# Patient Record
Sex: Female | Born: 1945 | Race: Black or African American | Hispanic: No | Marital: Single | Smoking: Current every day smoker
Health system: Southern US, Community
[De-identification: ages and names within clinical notes are randomized; demographics above are authoritative.]

## PROBLEM LIST (undated history)

## (undated) DIAGNOSIS — E7800 Pure hypercholesterolemia, unspecified: Secondary | ICD-10-CM

## (undated) DIAGNOSIS — K219 Gastro-esophageal reflux disease without esophagitis: Secondary | ICD-10-CM

## (undated) HISTORY — PX: BREAST LUMPECTOMY: SHX2

## (undated) HISTORY — PX: LEG SURGERY: SHX1003

## (undated) HISTORY — PX: TUBAL LIGATION: SHX77

---

## 1997-10-29 ENCOUNTER — Emergency Department (HOSPITAL_COMMUNITY): Admission: EM | Admit: 1997-10-29 | Discharge: 1997-10-29 | Payer: Self-pay | Admitting: Emergency Medicine

## 1997-11-05 ENCOUNTER — Encounter: Payer: Self-pay | Admitting: Internal Medicine

## 1997-11-05 ENCOUNTER — Ambulatory Visit (HOSPITAL_COMMUNITY): Admission: RE | Admit: 1997-11-05 | Discharge: 1997-11-05 | Payer: Self-pay | Admitting: Internal Medicine

## 1997-11-05 ENCOUNTER — Encounter: Admission: RE | Admit: 1997-11-05 | Discharge: 1997-11-05 | Payer: Self-pay | Admitting: Internal Medicine

## 1997-11-22 ENCOUNTER — Encounter: Admission: RE | Admit: 1997-11-22 | Discharge: 1997-12-22 | Payer: Self-pay | Admitting: *Deleted

## 1997-11-26 ENCOUNTER — Encounter: Admission: RE | Admit: 1997-11-26 | Discharge: 1997-11-26 | Payer: Self-pay | Admitting: Internal Medicine

## 1997-12-17 ENCOUNTER — Encounter: Admission: RE | Admit: 1997-12-17 | Discharge: 1997-12-17 | Payer: Self-pay | Admitting: Hematology and Oncology

## 1997-12-25 ENCOUNTER — Ambulatory Visit (HOSPITAL_COMMUNITY): Admission: RE | Admit: 1997-12-25 | Discharge: 1997-12-25 | Payer: Self-pay | Admitting: Hematology and Oncology

## 1998-01-02 ENCOUNTER — Encounter: Payer: Self-pay | Admitting: Hematology and Oncology

## 1998-01-04 ENCOUNTER — Encounter: Admission: RE | Admit: 1998-01-04 | Discharge: 1998-01-04 | Payer: Self-pay | Admitting: Internal Medicine

## 1998-02-14 ENCOUNTER — Encounter: Admission: RE | Admit: 1998-02-14 | Discharge: 1998-02-14 | Payer: Self-pay | Admitting: Internal Medicine

## 1998-06-10 ENCOUNTER — Encounter: Admission: RE | Admit: 1998-06-10 | Discharge: 1998-06-10 | Payer: Self-pay | Admitting: Internal Medicine

## 1998-09-19 ENCOUNTER — Encounter: Admission: RE | Admit: 1998-09-19 | Discharge: 1998-09-19 | Payer: Self-pay | Admitting: Internal Medicine

## 1998-09-23 ENCOUNTER — Encounter: Admission: RE | Admit: 1998-09-23 | Discharge: 1998-09-23 | Payer: Self-pay | Admitting: Obstetrics & Gynecology

## 1998-10-28 ENCOUNTER — Encounter: Admission: RE | Admit: 1998-10-28 | Discharge: 1998-10-28 | Payer: Self-pay | Admitting: Obstetrics & Gynecology

## 2001-06-20 ENCOUNTER — Ambulatory Visit (HOSPITAL_COMMUNITY): Admission: RE | Admit: 2001-06-20 | Discharge: 2001-06-20 | Payer: Self-pay | Admitting: *Deleted

## 2001-06-20 ENCOUNTER — Encounter: Payer: Self-pay | Admitting: Pediatrics

## 2011-01-07 ENCOUNTER — Other Ambulatory Visit: Payer: Self-pay

## 2011-01-07 ENCOUNTER — Emergency Department (INDEPENDENT_AMBULATORY_CARE_PROVIDER_SITE_OTHER): Payer: Medicare Other

## 2011-01-07 ENCOUNTER — Emergency Department (HOSPITAL_BASED_OUTPATIENT_CLINIC_OR_DEPARTMENT_OTHER)
Admission: EM | Admit: 2011-01-07 | Discharge: 2011-01-07 | Disposition: A | Discharge status: Home / Self Care | Payer: Medicare Other | Attending: Emergency Medicine | Admitting: Emergency Medicine

## 2011-01-07 ENCOUNTER — Encounter: Payer: Self-pay | Admitting: Emergency Medicine

## 2011-01-07 DIAGNOSIS — E7800 Pure hypercholesterolemia, unspecified: Secondary | ICD-10-CM | POA: Insufficient documentation

## 2011-01-07 DIAGNOSIS — R252 Cramp and spasm: Secondary | ICD-10-CM

## 2011-01-07 DIAGNOSIS — R0602 Shortness of breath: Secondary | ICD-10-CM

## 2011-01-07 DIAGNOSIS — R209 Unspecified disturbances of skin sensation: Secondary | ICD-10-CM

## 2011-01-07 DIAGNOSIS — J45909 Unspecified asthma, uncomplicated: Secondary | ICD-10-CM | POA: Insufficient documentation

## 2011-01-07 DIAGNOSIS — R5381 Other malaise: Secondary | ICD-10-CM

## 2011-01-07 DIAGNOSIS — R202 Paresthesia of skin: Secondary | ICD-10-CM

## 2011-01-07 DIAGNOSIS — R5383 Other fatigue: Secondary | ICD-10-CM

## 2011-01-07 DIAGNOSIS — R55 Syncope and collapse: Secondary | ICD-10-CM

## 2011-01-07 DIAGNOSIS — J323 Chronic sphenoidal sinusitis: Secondary | ICD-10-CM | POA: Insufficient documentation

## 2011-01-07 DIAGNOSIS — I10 Essential (primary) hypertension: Secondary | ICD-10-CM | POA: Insufficient documentation

## 2011-01-07 DIAGNOSIS — Z79899 Other long term (current) drug therapy: Secondary | ICD-10-CM | POA: Insufficient documentation

## 2011-01-07 DIAGNOSIS — M47814 Spondylosis without myelopathy or radiculopathy, thoracic region: Secondary | ICD-10-CM | POA: Insufficient documentation

## 2011-01-07 DIAGNOSIS — J322 Chronic ethmoidal sinusitis: Secondary | ICD-10-CM

## 2011-01-07 DIAGNOSIS — M79609 Pain in unspecified limb: Secondary | ICD-10-CM | POA: Insufficient documentation

## 2011-01-07 HISTORY — DX: Pure hypercholesterolemia, unspecified: E78.00

## 2011-01-07 LAB — BASIC METABOLIC PANEL
Chloride: 101 mEq/L (ref 96–112)
Creatinine, Ser: 0.9 mg/dL (ref 0.50–1.10)
GFR calc Af Amer: 76 mL/min — ABNORMAL LOW (ref >90)
Potassium: 4.9 mEq/L (ref 3.5–5.1)

## 2011-01-07 LAB — CBC
HCT: 41.5 % (ref 36.0–46.0)
Hemoglobin: 14.5 g/dL (ref 12.0–15.0)
RDW: 12.7 % (ref 11.5–15.5)
WBC: 7.5 10*3/uL (ref 4.0–10.5)

## 2011-01-07 LAB — TROPONIN I: Troponin I: <0.3 ng/mL (ref <0.30)

## 2011-01-07 LAB — CARDIAC PANEL(CRET KIN+CKTOT+MB+TROPI)
Relative Index: INVALID (ref 0.0–2.5)
Troponin I: <0.3 ng/mL (ref <0.30)

## 2011-01-07 NOTE — ED Provider Notes (Addendum)
History     CSN: [id number]  Arrival date & time 01/07/11  0706   First MD Initiated Contact with Patient 01/07/11 0719      Chief Complaint  Patient presents with  . Leg Pain  . Shortness of Breath  . Shaking   patient states that she stayed up all night long, making a Christmas dinner. She tried to lay down. Early this morning and then initially felt a cramping and tingling in her left lower extremity. The tingling sensation radiated upward. She initially thought that maybe do to "low potassium." She got up to walk and felt very woozy and dizzy and felt a "sloping feeling" she then became short of breath and felt like she was going to pass out.  She then had numbness and tingling in both extremities upper and lower. She denied any chest pain or nausea.  Denied any pleuritic pain or calf pain.   She felt that it may be anxiety. It began after she felt some tingling in her left leg. She also treated. The symptoms to staying up all my long admits that her sleeping hours have been very irregular lately. She denies any headache, slurred speech or blurred vision. She did call the ambulance, but decided to drive in by private vehicle. Patient had no difficulty ambulating. She now feels numbness and tingling in both feet and both hands. Patient denies any current pain or back pain. Denies abdominal pain. Denies any recent hospital admissions.  (Consider location/radiation/quality/duration/timing/severity/associated sxs/prior treatment) HPI  Past Medical History  Diagnosis Date  . Asthma   . Hypertension   . Vitamin D deficiency   . Hypercholesteremia     History reviewed. No pertinent past surgical history.  History reviewed. No pertinent family history.  History  Substance Use Topics  . Smoking status: Current Everyday Smoker  . Smokeless tobacco: Not on file  . Alcohol Use:     OB History    Grav Para Term Preterm Abortions TAB SAB Ect Mult Living                  Review of  Systems  All other systems reviewed and are negative.    Allergies  Codeine sulfate; Ibuprofen; Pravastatin sodium; and Penicillins  Home Medications   Current Outpatient Rx  Name Route Sig Dispense Refill  . ALBUTEROL SULFATE HFA 108 (90 BASE) MCG/ACT IN AERS Inhalation Inhale 2 puffs into the lungs every 6 (six) hours as needed.      . ASPIRIN 81 MG PO TABS Oral Take 81 mg by mouth daily.      Marland Kitchen CALCIUM CITRATE-VITAMIN D 200-200 MG-UNIT PO TABS Oral Take 2 tablets by mouth daily.      Marland Kitchen CETIRIZINE HCL 10 MG PO TABS Oral Take 10 mg by mouth daily.      Marland Kitchen CLONAZEPAM 1 MG PO TABS Oral Take 1 mg by mouth at bedtime as needed.      Marland Kitchen CLOTRIMAZOLE-BETAMETHASONE 1-0.05 % EX CREA Topical Apply 1 application topically 2 (two) times daily.      . CYCLOBENZAPRINE HCL 10 MG PO TABS Oral Take 10 mg by mouth at bedtime as needed.      . OMEGA-3 FATTY ACIDS 1000 MG PO CAPS Oral Take 1 g by mouth daily.      . MULTIVITAMINS PO CAPS Oral Take 1 capsule by mouth daily.      Marland Kitchen RABEPRAZOLE SODIUM 20 MG PO TBEC Oral Take 20 mg by mouth daily.      Marland Kitchen  ROSUVASTATIN CALCIUM 5 MG PO TABS Oral Take 5 mg by mouth daily.        BP 125/67  Pulse 73  Temp(Src) 97.5 F (36.4 C) (Oral)  Resp 17  SpO2 97%  Physical Exam  Nursing note and vitals reviewed. Constitutional: She is oriented to person, place, and time. She appears well-developed and well-nourished. No distress.  HENT:  Head: Normocephalic and atraumatic.  Eyes: Conjunctivae and EOM are normal. Pupils are equal, round, and reactive to light.  Neck: Neck supple.  Cardiovascular: Normal rate and regular rhythm.  Exam reveals no gallop and no friction rub.   No murmur heard. Pulmonary/Chest: Effort normal and breath sounds normal. She has no wheezes. She has no rales. She exhibits no tenderness.  Abdominal: Soft. Bowel sounds are normal. She exhibits no distension. There is no tenderness. There is no rebound and no guarding.  Musculoskeletal:  Normal range of motion.  Neurological: She is alert and oriented to person, place, and time. She has normal reflexes. She displays normal reflexes. No cranial nerve deficit. She exhibits normal muscle tone. Coordination normal.       No facial droop, tongue protrudes to the midline. No pronator drift. 5 out of 5 motor strength in upper and lower extremities, equal and symmetric bilaterally. Gait is examined, and is normal. Cranial nerves intact as tested  Skin: Skin is warm and dry. No rash noted.  Psychiatric: She has a normal mood and affect.    ED Course  Procedures (including critical care time)  Labs Reviewed  BASIC METABOLIC PANEL - Abnormal; Notable for the following:    Glucose, Bld 106 (*)    GFR calc non Af Amer 66 (*)    GFR calc Af Amer 76 (*)    All other components within normal limits  CARDIAC PANEL(CRET KIN+CKTOT+MB+TROPI)  CBC  TROPONIN I   Dg Chest 2 View  01/07/2011  *RADIOLOGY REPORT*  Clinical Data: Shortness of breath.  Weakness.  Numbness and tingling in the lower extremities.  CHEST - 2 VIEW  Comparison: None.  Findings: Mild thoracic spondylosis noted.  Cardiac and mediastinal contours appear normal.  The lungs appear clear.  No pleural effusion is identified.  IMPRESSION:  1.  Mild thoracic spondylosis. 2.   Otherwise, no significant abnormality identified.  Original Report Authenticated By: Dellia Cloud, M.D.   Ct Head Wo Contrast  01/07/2011  *RADIOLOGY REPORT*  Clinical Data: Shortness of breath.  Weakness.  Numbness and tingling in the lower extremities.  CT HEAD WITHOUT CONTRAST  Technique:  Contiguous axial images were obtained from the base of the skull through the vertex without contrast.  Comparison: None.  Findings: The brain stem, cerebellum, cerebral peduncles, thalami, basal ganglia, ventricular system, and basilar cisterns appear unremarkable.  No intracranial hemorrhage, mass lesion, or acute infarction is identified.  There is mild chronic  ethmoid sinusitis.  Minimal sphenoid sinusitis noted.  IMPRESSION:  1.  Mild chronic ethmoid and sphenoid sinusitis.   Otherwise, no significant abnormality identified.  Original Report Authenticated By: Dellia Cloud, M.D.     No diagnosis found.    MDM  Pt is seen and examined;  Initial history and physical completed.  Will follow.       Date: 01/07/2011  Rate: 84  Rhythm: normal sinus rhythm  QRS Axis: normal  Intervals: normal  ST/T Wave abnormalities: normal  Conduction Disutrbances:none  Narrative Interpretation:   Old EKG Reviewed: none available Q waves in III, aVF, LAA  Results for orders placed during the hospital encounter of 01/07/11  CARDIAC PANEL(CRET KIN+CKTOT+MB+TROPI)      Component Value Range   Total CK 74  7 - 177 (U/L)   CK, MB 2.3  0.3 - 4.0 (ng/mL)   Troponin I <0.30  <0.30 (ng/mL)   Relative Index RELATIVE INDEX IS INVALID  0.0 - 2.5   BASIC METABOLIC PANEL      Component Value Range   Sodium 137  135 - 145 (mEq/L)   Potassium 4.9  3.5 - 5.1 (mEq/L)   Chloride 101  96 - 112 (mEq/L)   CO2 25  19 - 32 (mEq/L)   Glucose, Bld 106 (*) 70 - 99 (mg/dL)   BUN 13  6 - 23 (mg/dL)   Creatinine, Ser 1.61  0.50 - 1.10 (mg/dL)   Calcium 9.8  8.4 - 09.6 (mg/dL)   GFR calc non Af Amer 66 (*) >90 (mL/min)   GFR calc Af Amer 76 (*) >90 (mL/min)  CBC      Component Value Range   WBC 7.5  4.0 - 10.5 (K/uL)   RBC 4.66  3.87 - 5.11 (MIL/uL)   Hemoglobin 14.5  12.0 - 15.0 (g/dL)   HCT 04.5  40.9 - 81.1 (%)   MCV 89.1  78.0 - 100.0 (fL)   MCH 31.1  26.0 - 34.0 (pg)   MCHC 34.9  30.0 - 36.0 (g/dL)   RDW 91.4  78.2 - 95.6 (%)   Platelets 262  150 - 400 (K/uL)  TROPONIN I      Component Value Range   Troponin I <0.30  <0.30 (ng/mL)   Dg Chest 2 View  01/07/2011  *RADIOLOGY REPORT*  Clinical Data: Shortness of breath.  Weakness.  Numbness and tingling in the lower extremities.  CHEST - 2 VIEW  Comparison: None.  Findings: Mild thoracic spondylosis  noted.  Cardiac and mediastinal contours appear normal.  The lungs appear clear.  No pleural effusion is identified.  IMPRESSION:  1.  Mild thoracic spondylosis. 2.   Otherwise, no significant abnormality identified.  Original Report Authenticated By: Dellia Cloud, M.D.   Ct Head Wo Contrast  01/07/2011  *RADIOLOGY REPORT*  Clinical Data: Shortness of breath.  Weakness.  Numbness and tingling in the lower extremities.  CT HEAD WITHOUT CONTRAST  Technique:  Contiguous axial images were obtained from the base of the skull through the vertex without contrast.  Comparison: None.  Findings: The brain stem, cerebellum, cerebral peduncles, thalami, basal ganglia, ventricular system, and basilar cisterns appear unremarkable.  No intracranial hemorrhage, mass lesion, or acute infarction is identified.  There is mild chronic ethmoid sinusitis.  Minimal sphenoid sinusitis noted.  IMPRESSION:  1.  Mild chronic ethmoid and sphenoid sinusitis.   Otherwise, no significant abnormality identified.  Original Report Authenticated By: Dellia Cloud, M.D.     10:25 AM  Patient is reassessed in the room, no longer symptomatic, feeling well. Initial tests are all normal. Cardiac markers normal. Chest x-ray and CAT scan of the head are unremarkable.  Electrolytes are normal. CBC is normal. Will likely repeat cardiac markers at 3 hours and repeat EKG and is normal. Sent home.  10:25 AM  Date: 01/07/2011  Rate: 73  Rhythm: normal sinus rhythm  QRS Axis: normal  Intervals: normal  ST/T Wave abnormalities: normal  Conduction Disutrbances:none  Narrative Interpretation:   Old EKG Reviewed: unchanged Artifact in V1, otherwise normal   10:25 AM  Reassessed, feeling well, jovial.  Has  been asymptomatic entire time in ED.  Wants to go home.  Encourage close followup with her primary medical doctor within 2-3 days. Patient also knows to return to ED for any recurring symptoms or concerns at anytime.  Savian Mazon  A. Patrica Duel, MD 01/07/11 1030  Linsay Vogt A. Patrica Duel, MD 01/07/11 [phone]

## 2011-01-07 NOTE — ED Notes (Signed)
Pt states she had gone to bed this am at 6 am, starting having cramping in feet, radiating to legs.  Pt felt sob, and felt "funny".

## 2012-03-19 ENCOUNTER — Emergency Department (HOSPITAL_BASED_OUTPATIENT_CLINIC_OR_DEPARTMENT_OTHER)
Admission: EM | Admit: 2012-03-19 | Discharge: 2012-03-19 | Disposition: A | Discharge status: Home / Self Care | Payer: Medicare Other | Attending: Emergency Medicine | Admitting: Emergency Medicine

## 2012-03-19 ENCOUNTER — Encounter (HOSPITAL_BASED_OUTPATIENT_CLINIC_OR_DEPARTMENT_OTHER): Payer: Self-pay

## 2012-03-19 DIAGNOSIS — S39012A Strain of muscle, fascia and tendon of lower back, initial encounter: Secondary | ICD-10-CM

## 2012-03-19 DIAGNOSIS — K921 Melena: Secondary | ICD-10-CM | POA: Insufficient documentation

## 2012-03-19 DIAGNOSIS — Y929 Unspecified place or not applicable: Secondary | ICD-10-CM | POA: Insufficient documentation

## 2012-03-19 DIAGNOSIS — R5381 Other malaise: Secondary | ICD-10-CM | POA: Insufficient documentation

## 2012-03-19 DIAGNOSIS — F17200 Nicotine dependence, unspecified, uncomplicated: Secondary | ICD-10-CM | POA: Insufficient documentation

## 2012-03-19 DIAGNOSIS — Z7982 Long term (current) use of aspirin: Secondary | ICD-10-CM | POA: Insufficient documentation

## 2012-03-19 DIAGNOSIS — Y93H2 Activity, gardening and landscaping: Secondary | ICD-10-CM | POA: Insufficient documentation

## 2012-03-19 DIAGNOSIS — J45909 Unspecified asthma, uncomplicated: Secondary | ICD-10-CM | POA: Insufficient documentation

## 2012-03-19 DIAGNOSIS — Z8639 Personal history of other endocrine, nutritional and metabolic disease: Secondary | ICD-10-CM | POA: Insufficient documentation

## 2012-03-19 DIAGNOSIS — X58XXXA Exposure to other specified factors, initial encounter: Secondary | ICD-10-CM | POA: Insufficient documentation

## 2012-03-19 DIAGNOSIS — R109 Unspecified abdominal pain: Secondary | ICD-10-CM | POA: Insufficient documentation

## 2012-03-19 DIAGNOSIS — IMO0002 Reserved for concepts with insufficient information to code with codable children: Secondary | ICD-10-CM | POA: Insufficient documentation

## 2012-03-19 DIAGNOSIS — Z862 Personal history of diseases of the blood and blood-forming organs and certain disorders involving the immune mechanism: Secondary | ICD-10-CM | POA: Insufficient documentation

## 2012-03-19 DIAGNOSIS — E7800 Pure hypercholesterolemia, unspecified: Secondary | ICD-10-CM | POA: Insufficient documentation

## 2012-03-19 DIAGNOSIS — K625 Hemorrhage of anus and rectum: Secondary | ICD-10-CM | POA: Insufficient documentation

## 2012-03-19 DIAGNOSIS — Z79899 Other long term (current) drug therapy: Secondary | ICD-10-CM | POA: Insufficient documentation

## 2012-03-19 LAB — CBC WITH DIFFERENTIAL/PLATELET
Band Neutrophils: 1 % (ref 0–10)
Basophils Absolute: 0 10*3/uL (ref 0.0–0.1)
Eosinophils Absolute: 0.1 10*3/uL (ref 0.0–0.7)
HCT: 41.2 % (ref 36.0–46.0)
Lymphocytes Relative: 34 % (ref 12–46)
Monocytes Absolute: 0.4 10*3/uL (ref 0.1–1.0)
Monocytes Relative: 6 % (ref 3–12)
Neutrophils Relative %: 57 % (ref 43–77)
Platelets: 257 10*3/uL (ref 150–400)
RDW: 12.4 % (ref 11.5–15.5)
WBC: 7.2 10*3/uL (ref 4.0–10.5)

## 2012-03-19 LAB — URINALYSIS, ROUTINE W REFLEX MICROSCOPIC
Glucose, UA: NEGATIVE mg/dL
Leukocytes, UA: NEGATIVE
Nitrite: NEGATIVE
Protein, ur: NEGATIVE mg/dL

## 2012-03-19 LAB — BASIC METABOLIC PANEL
Calcium: 9.6 mg/dL (ref 8.4–10.5)
Creatinine, Ser: 0.9 mg/dL (ref 0.50–1.10)
GFR calc non Af Amer: 65 mL/min — ABNORMAL LOW (ref >90)
Sodium: 138 mEq/L (ref 135–145)

## 2012-03-19 LAB — OCCULT BLOOD X 1 CARD TO LAB, STOOL: Fecal Occult Bld: NEGATIVE

## 2012-03-19 NOTE — ED Provider Notes (Signed)
History     CSN: [id number]  Arrival date & time 03/19/12  1307   First MD Initiated Contact with Patient 03/19/12 1326      Chief Complaint  Patient presents with  . Hematuria  . Rectal Bleeding    (Consider location/radiation/quality/duration/timing/severity/associated sxs/prior treatment) HPI Comments: Patient is a 67 year old female with a history of anxiety who presents for one episode of blood in her urine at 1 AM this morning as well as one episode of black stool with red streaks around 10 AM this morning. Patient states that she has had associated right sided low back pain which patient states started after a full day of gardening yesterday. Denies aggravating or alleviating factors. Patient admits to associated fatigue. She denies fever, chest pain, shortness of breath, abdominal pain, nausea, vomiting, diarrhea, dysuria, numbness and tingling in her lower extremities, and recent trauma. Patient is in no acute distress and does not appear uncomfortable. Patient states she is due for a colonoscopy in the next one to 2 years; leaves her last colonoscopy was done when she was 3.  Patient is a 67 y.o. female presenting with hematuria and hematochezia.  Hematuria Associated symptoms include fatigue. Pertinent negatives include no abdominal pain, chest pain, chills, fever, nausea, numbness, vomiting or weakness.  Rectal Bleeding  Associated symptoms include hematuria. Pertinent negatives include no fever, no abdominal pain, no diarrhea, no nausea, no vomiting and no chest pain.    Past Medical History  Diagnosis Date  . Asthma   . Vitamin D deficiency   . Hypercholesteremia     Past Surgical History  Procedure Laterality Date  . Leg surgery    . Tubal ligation    . Breast lumpectomy      No family history on file.  History  Substance Use Topics  . Smoking status: Current Every Day Smoker  . Smokeless tobacco: Not on file  . Alcohol Use: Yes    OB History   Grav  Para Term Preterm Abortions TAB SAB Ect Mult Living                  Review of Systems  Constitutional: Positive for fatigue. Negative for fever and chills.  Eyes: Negative for visual disturbance.  Respiratory: Negative for chest tightness and shortness of breath.   Cardiovascular: Negative for chest pain.  Gastrointestinal: Positive for hematochezia. Negative for nausea, vomiting, abdominal pain and diarrhea.  Genitourinary: Positive for hematuria. Negative for dysuria.  Musculoskeletal:       R flank pain  Skin: Negative for color change.  Neurological: Negative for dizziness, syncope, weakness, light-headedness and numbness.  All other systems reviewed and are negative.    Allergies  Codeine sulfate; Ibuprofen; Pravastatin sodium; and Penicillins  Home Medications   Current Outpatient Rx  Name  Route  Sig  Dispense  Refill  . albuterol (PROVENTIL HFA;VENTOLIN HFA) 108 (90 BASE) MCG/ACT inhaler   Inhalation   Inhale 2 puffs into the lungs every 6 (six) hours as needed.           Marland Kitchen aspirin 81 MG tablet   Oral   Take 81 mg by mouth daily.           . calcium citrate-vitamin D 200-200 MG-UNIT TABS   Oral   Take 2 tablets by mouth daily.           . cetirizine (ZYRTEC) 10 MG tablet   Oral   Take 10 mg by mouth daily.           Marland Kitchen  clonazePAM (KLONOPIN) 1 MG tablet   Oral   Take 1 mg by mouth at bedtime as needed.           . clotrimazole-betamethasone (LOTRISONE) cream   Topical   Apply 1 application topically 2 (two) times daily.           . cyclobenzaprine (FLEXERIL) 10 MG tablet   Oral   Take 10 mg by mouth at bedtime as needed.           . fish oil-omega-3 fatty acids 1000 MG capsule   Oral   Take 1 g by mouth daily.           . Multiple Vitamin (MULTIVITAMIN) capsule   Oral   Take 1 capsule by mouth daily.           . RABEprazole (ACIPHEX) 20 MG tablet   Oral   Take 20 mg by mouth daily.           . rosuvastatin (CRESTOR) 5 MG  tablet   Oral   Take 5 mg by mouth daily.             BP 151/76  Pulse 79  Temp(Src) 98 F (36.7 C) (Oral)  Resp 18  Wt 224 lb 9 oz (101.861 kg)  SpO2 99%  Physical Exam  Nursing note and vitals reviewed. Constitutional: She is oriented to person, place, and time. She appears well-developed. No distress.  Morbidly obese. The patient is resting comfortably in no acute distress.  HENT:  Head: Normocephalic and atraumatic.  Right Ear: External ear normal.  Left Ear: External ear normal.  Mouth/Throat: Oropharynx is clear and moist. No oropharyngeal exudate.  Eyes: Conjunctivae are normal. Pupils are equal, round, and reactive to light. No scleral icterus.  Neck: Normal range of motion. Neck supple.  Cardiovascular: Normal rate, regular rhythm, normal heart sounds and intact distal pulses.   Pulmonary/Chest: Effort normal and breath sounds normal. No respiratory distress. She has no wheezes. She has no rales.  Abdominal: Soft. Bowel sounds are normal. She exhibits no mass. There is no tenderness. There is no rebound and no guarding.  Genitourinary:  Brown stool on rectal exam; no gross blood appreciated.  Musculoskeletal: Normal range of motion.       Lumbar back: She exhibits tenderness. She exhibits normal range of motion, no bony tenderness, no swelling, no edema and no deformity.       Back:  No step offs or deformity of the lumbar spine appreciated.  Lymphadenopathy:    She has no cervical adenopathy.  Neurological: She is alert and oriented to person, place, and time.  Skin: Skin is warm and dry. No rash noted. She is not diaphoretic. No erythema.  Psychiatric: She has a normal mood and affect. Her behavior is normal.    ED Course  Procedures (including critical care time)  Labs Reviewed  BASIC METABOLIC PANEL - Abnormal; Notable for the following:    GFR calc non Af Amer 65 (*)    GFR calc Af Amer 76 (*)    All other components within normal limits  URINALYSIS,  ROUTINE W REFLEX MICROSCOPIC  CBC WITH DIFFERENTIAL  OCCULT BLOOD X 1 CARD TO LAB, STOOL   No results found.   1. Low back strain, initial encounter      MDM  Patient presents for 1 episode of hematuria and 1 episode of hematochezia since 2AM. Work up which included CBC, BMP, UA, and hemoccult negative for signs of  infection, anemia, electrolyte imbalance, improper kidney fuction, UTI, or kidney stone; hemoccult negative. Patient has been pleasant and well appearing during ED stay with stable vital signs; physical exam findings benign. No further w/u with imaging is warranted at this time. She will be d/c with PCP follow up and has been given indications for ED return. Patient states comfort and understanding with this d/c plan. Patient discussed with Dr. Ranae Palms prior to ED d/c.  Filed Vitals:   03/19/12 1326  BP: 151/76  Pulse: 79  Temp: 98 F (36.7 C)  TempSrc: Oral  Resp: 18  Weight: 224 lb 9 oz (101.861 kg)  SpO2: 99%           Antony Madura, PA-C 03/21/12 2059

## 2012-03-19 NOTE — ED Notes (Signed)
Blood in urine x 1-Yesterday-blood in stool x1 today-c/o lower back pain

## 2012-03-21 NOTE — ED Provider Notes (Signed)
Medical screening examination/treatment/procedure(s) were conducted as a shared visit with non-physician practitioner(s) and myself.  I personally evaluated the patient during the encounter   Loren Racer, MD 03/21/12 2302

## 2014-09-29 ENCOUNTER — Emergency Department (HOSPITAL_BASED_OUTPATIENT_CLINIC_OR_DEPARTMENT_OTHER)
Admission: EM | Admit: 2014-09-29 | Discharge: 2014-09-29 | Disposition: A | Discharge status: Home / Self Care | Payer: Medicare HMO | Attending: Emergency Medicine | Admitting: Emergency Medicine

## 2014-09-29 ENCOUNTER — Emergency Department (HOSPITAL_BASED_OUTPATIENT_CLINIC_OR_DEPARTMENT_OTHER): Payer: Medicare HMO

## 2014-09-29 ENCOUNTER — Encounter (HOSPITAL_BASED_OUTPATIENT_CLINIC_OR_DEPARTMENT_OTHER): Payer: Self-pay

## 2014-09-29 DIAGNOSIS — L299 Pruritus, unspecified: Secondary | ICD-10-CM | POA: Diagnosis not present

## 2014-09-29 DIAGNOSIS — N950 Postmenopausal bleeding: Secondary | ICD-10-CM | POA: Insufficient documentation

## 2014-09-29 DIAGNOSIS — E559 Vitamin D deficiency, unspecified: Secondary | ICD-10-CM | POA: Diagnosis not present

## 2014-09-29 DIAGNOSIS — E780 Pure hypercholesterolemia: Secondary | ICD-10-CM | POA: Insufficient documentation

## 2014-09-29 DIAGNOSIS — J45909 Unspecified asthma, uncomplicated: Secondary | ICD-10-CM | POA: Diagnosis not present

## 2014-09-29 DIAGNOSIS — N898 Other specified noninflammatory disorders of vagina: Secondary | ICD-10-CM | POA: Diagnosis present

## 2014-09-29 DIAGNOSIS — R0789 Other chest pain: Secondary | ICD-10-CM | POA: Insufficient documentation

## 2014-09-29 DIAGNOSIS — Z720 Tobacco use: Secondary | ICD-10-CM | POA: Insufficient documentation

## 2014-09-29 DIAGNOSIS — Z7982 Long term (current) use of aspirin: Secondary | ICD-10-CM | POA: Diagnosis not present

## 2014-09-29 DIAGNOSIS — Z79899 Other long term (current) drug therapy: Secondary | ICD-10-CM | POA: Insufficient documentation

## 2014-09-29 DIAGNOSIS — Z880 Allergy status to penicillin: Secondary | ICD-10-CM | POA: Insufficient documentation

## 2014-09-29 LAB — URINALYSIS, ROUTINE W REFLEX MICROSCOPIC
Bilirubin Urine: NEGATIVE
GLUCOSE, UA: NEGATIVE mg/dL
HGB URINE DIPSTICK: NEGATIVE
Ketones, ur: NEGATIVE mg/dL
Leukocytes, UA: NEGATIVE
Nitrite: NEGATIVE
PH: 5 (ref 5.0–8.0)
Protein, ur: NEGATIVE mg/dL
SPECIFIC GRAVITY, URINE: 1.019 (ref 1.005–1.030)
Urobilinogen, UA: 0.2 mg/dL (ref 0.0–1.0)

## 2014-09-29 LAB — CBC WITH DIFFERENTIAL/PLATELET
BASOS ABS: 0 10*3/uL (ref 0.0–0.1)
BASOS PCT: 1 %
Eosinophils Absolute: 0.6 10*3/uL (ref 0.0–0.7)
Eosinophils Relative: 9 %
HEMATOCRIT: 43.6 % (ref 36.0–46.0)
HEMOGLOBIN: 14.8 g/dL (ref 12.0–15.0)
LYMPHS PCT: 41 %
Lymphs Abs: 2.6 10*3/uL (ref 0.7–4.0)
MCH: 31 pg (ref 26.0–34.0)
MCHC: 33.9 g/dL (ref 30.0–36.0)
MCV: 91.2 fL (ref 78.0–100.0)
MONO ABS: 0.6 10*3/uL (ref 0.1–1.0)
Monocytes Relative: 9 %
NEUTROS ABS: 2.5 10*3/uL (ref 1.7–7.7)
NEUTROS PCT: 40 %
Platelets: 246 10*3/uL (ref 150–400)
RBC: 4.78 MIL/uL (ref 3.87–5.11)
RDW: 12.5 % (ref 11.5–15.5)
WBC: 6.2 10*3/uL (ref 4.0–10.5)

## 2014-09-29 LAB — HEPATIC FUNCTION PANEL
ALBUMIN: 3.7 g/dL (ref 3.5–5.0)
ALK PHOS: 57 U/L (ref 38–126)
ALT: 23 U/L (ref 14–54)
AST: 27 U/L (ref 15–41)
BILIRUBIN TOTAL: 0.5 mg/dL (ref 0.3–1.2)
Bilirubin, Direct: 0.1 mg/dL (ref 0.1–0.5)
Indirect Bilirubin: 0.4 mg/dL (ref 0.3–0.9)
TOTAL PROTEIN: 6.9 g/dL (ref 6.5–8.1)

## 2014-09-29 LAB — TROPONIN I: Troponin I: <0.03 ng/mL (ref <0.031)

## 2014-09-29 LAB — BASIC METABOLIC PANEL
ANION GAP: 9 (ref 5–15)
BUN: 16 mg/dL (ref 6–20)
CHLORIDE: 106 mmol/L (ref 101–111)
CO2: 22 mmol/L (ref 22–32)
Calcium: 9.1 mg/dL (ref 8.9–10.3)
Creatinine, Ser: 0.65 mg/dL (ref 0.44–1.00)
GFR calc non Af Amer: >60 mL/min (ref >60)
GLUCOSE: 83 mg/dL (ref 65–99)
POTASSIUM: 4 mmol/L (ref 3.5–5.1)
Sodium: 137 mmol/L (ref 135–145)

## 2014-09-29 LAB — WET PREP, GENITAL
Clue Cells Wet Prep HPF POC: NONE SEEN
Trich, Wet Prep: NONE SEEN
Yeast Wet Prep HPF POC: NONE SEEN

## 2014-09-29 MED ORDER — HYDROCORTISONE 1 % EX CREA
TOPICAL_CREAM | Freq: Once | CUTANEOUS | Status: AC
  Administered 2014-09-29: 16:00:00 via TOPICAL
  Filled 2014-09-29: qty 28

## 2014-09-29 MED ORDER — HYDROXYZINE HCL 25 MG PO TABS: 25.0000 mg | ORAL_TABLET | Freq: Four times a day (QID) | ORAL | Status: AC

## 2014-09-29 MED ORDER — HYDROXYZINE HCL 25 MG PO TABS
25.0000 mg | ORAL_TABLET | Freq: Once | ORAL | Status: AC
  Administered 2014-09-29: 25 mg via ORAL
  Filled 2014-09-29: qty 1

## 2014-09-29 MED ORDER — FAMOTIDINE 20 MG PO TABS
20.0000 mg | ORAL_TABLET | Freq: Once | ORAL | Status: AC
  Administered 2014-09-29: 20 mg via ORAL
  Filled 2014-09-29: qty 1

## 2014-09-29 MED ORDER — FAMOTIDINE 20 MG PO TABS: 20.0000 mg | ORAL_TABLET | Freq: Every day | ORAL | Status: AC

## 2014-09-29 NOTE — ED Notes (Signed)
C/o vaginal d/c, cough x 2 weeks

## 2014-09-29 NOTE — ED Provider Notes (Signed)
CSN: [id number]     Arrival date & time 09/29/14  1208 History   First MD Initiated Contact with Patient 09/29/14 1222     Chief Complaint  Patient presents with  . Vaginal Discharge     (Consider location/radiation/quality/duration/timing/severity/associated sxs/prior Treatment) HPI   Blood pressure 144/68, pulse 84, temperature 98.4 F (36.9 C), temperature source Oral, resp. rate 18, height  (1.6 m), weight 237 lb (107.502 kg), SpO2 96 %.  Mary Moore is a 69 y.o. female with past medical history significant for asthma, high cholesterol, active daily smoker complaining of a burning substernal chest pain that is nonexertional and not associated with fever. Patient has dry cough and intermittent shortness of breath. She reports rhinorrhea are not relieved with sertraline and Mucinex with a profuse clear rhinorrhea onset 2 weeks ago. Patient states that she's been having clear vaginal discharge onset 1 month ago, states that she had her "yearly douche" x2 weeks ago and blood afterwards, she denies any active vaginal bleeding. Aches that when she coughs she can feel bubbles coming out of the vagina, states that there's no possibility that this could be urine leakage. She has an appointment with her OB/GYN and primary care physician set up next week. She denies abdominal pain, fever, chills. She states that she is not sexually active and has not been sexually active in the last 6 years. She takes a daily low-dose aspirin. Patient's father died of a massive heart attack 15 years ago. Patient also reports a diffuse pruritus with no rash, states she used to have eczema and her.Dr. gives her shot once a year which he alleviates this. She denies jaundice, nausea vomiting.   Past Medical History  Diagnosis Date  . Asthma   . Vitamin D deficiency   . Hypercholesteremia    Past Surgical History  Procedure Laterality Date  . Leg surgery    . Tubal ligation    . Breast lumpectomy     No  family history on file. Social History  Substance Use Topics  . Smoking status: Current Every Day Smoker  . Smokeless tobacco: None  . Alcohol Use: Yes   OB History    No data available     Review of Systems  10 systems reviewed and found to be negative, except as noted in the HPI.   Allergies  Codeine sulfate; Ibuprofen; Other; Pravastatin sodium; and Penicillins  Home Medications   Prior to Admission medications   Medication Sig Start Date End Date Taking? Authorizing Provider  albuterol (PROVENTIL HFA;VENTOLIN HFA) 108 (90 BASE) MCG/ACT inhaler Inhale 2 puffs into the lungs every 6 (six) hours as needed.      Historical Provider, MD  aspirin 81 MG tablet Take 81 mg by mouth daily.      Historical Provider, MD  calcium citrate-vitamin D 200-200 MG-UNIT TABS Take 2 tablets by mouth daily.      Historical Provider, MD  cetirizine (ZYRTEC) 10 MG tablet Take 10 mg by mouth daily.      Historical Provider, MD  clonazePAM (KLONOPIN) 1 MG tablet Take 1 mg by mouth at bedtime as needed.      Historical Provider, MD  clotrimazole-betamethasone (LOTRISONE) cream Apply 1 application topically 2 (two) times daily.      Historical Provider, MD  fish oil-omega-3 fatty acids 1000 MG capsule Take 1 g by mouth daily.      Historical Provider, MD  Multiple Vitamin (MULTIVITAMIN) capsule Take 1 capsule by mouth daily.  Historical Provider, MD   BP 140/66 mmHg  Pulse 69  Temp(Src) 98.4 F (36.9 C) (Oral)  Resp 16  Ht  (1.6 m)  Wt 237 lb (107.502 kg)  BMI 41.99 kg/m2  SpO2 100% Physical Exam  Constitutional: She is oriented to person, place, and time. She appears well-developed and well-nourished. No distress.  HENT:  Head: Normocephalic and atraumatic.  Mouth/Throat: Oropharynx is clear and moist.  Eyes: Conjunctivae and EOM are normal. Pupils are equal, round, and reactive to light. No scleral icterus.  Neck: Normal range of motion. No JVD present. No tracheal deviation  present.  Cardiovascular: Normal rate, regular rhythm and intact distal pulses.   Radial pulse equal bilaterally  Pulmonary/Chest: Effort normal and breath sounds normal. No stridor. No respiratory distress. She has no wheezes. She has no rales. She exhibits no tenderness.  Abdominal: Soft. Bowel sounds are normal. She exhibits no distension and no mass. There is no tenderness. There is no rebound and no guarding.  Genitourinary:  Supervised PA student Mary Moore who performed the pelvic exam: No rashes or lesions, there is a scant clear vaginal discharge, no cervical motion or adnexal tenderness.  Musculoskeletal: Normal range of motion. She exhibits no edema or tenderness.  No calf asymmetry, superficial collaterals, palpable cords, edema, Homans sign negative bilaterally.    Neurological: She is alert and oriented to person, place, and time.  Skin: Skin is warm. Rash noted. She is not diaphoretic.  She has dry skin and mildly excoriated area to the right mid thoracic region, no overlying warmth, no focal lesions or rash.  Psychiatric: She has a normal mood and affect.  Nursing note and vitals reviewed.   ED Course  Procedures (including critical care time) Labs Review Labs Reviewed  WET PREP, GENITAL - Abnormal; Notable for the following:    WBC, Wet Prep HPF POC FEW (*)    All other components within normal limits  URINALYSIS, ROUTINE W REFLEX MICROSCOPIC (NOT AT [facility])  CBC WITH DIFFERENTIAL/PLATELET  BASIC METABOLIC PANEL  TROPONIN I  HEPATIC FUNCTION PANEL  TROPONIN I  GC/CHLAMYDIA PROBE AMP ([place]) NOT AT [facility]    Imaging Review Dg Chest 2 View  09/29/2014   CLINICAL DATA:  Cough, congestion and chest burning for 2 weeks. History of asthma. Initial encounter.  EXAM: CHEST  2 VIEW  COMPARISON:  01/07/2011 radiographs.  FINDINGS: The heart size and mediastinal contours are stable. There is stable mild chronic central airway thickening without hyperinflation, confluent  airspace opacity or pleural effusion. There is no pneumothorax. The bones appear unchanged.  IMPRESSION: Stable mild central airway thickening attributed to asthma. No acute cardiopulmonary process.   Electronically Signed   By: Carey Bullocks M.D.   On: 09/29/2014 12:39   I have personally reviewed and evaluated these images and lab results as part of my medical decision-making.   EKG Interpretation   Date/Time:  Wednesday September 29 2014 13:39:08 EDT Ventricular Rate:  78 PR Interval:  154 QRS Duration: 76 QT Interval:  394 QTC Calculation: 449 R Axis:   34 Text Interpretation:  Normal sinus rhythm Low voltage QRS Borderline ECG  Confirmed by MESNER MD, JASON [phone]) on 09/29/2014 1:43:37 PM      MDM   Final diagnoses:  Post-menopausal bleeding  Vaginal discharge  Atypical chest pain  Pruritus    Filed Vitals:   09/29/14 1214 09/29/14 1525  BP: 144/68 140/66  Pulse: 84 69  Temp: 98.4 F (36.9 C)  TempSrc: Oral   Resp: 18 16  Height:  (1.6 m)   Weight: 237 lb (107.502 kg)   SpO2: 96% 100%    Medications  hydrocortisone cream 1 % (not administered)  famotidine (PEPCID) tablet 20 mg (20 mg Oral Given 09/29/14 1353)  hydrOXYzine (ATARAX/VISTARIL) tablet 25 mg (25 mg Oral Given 09/29/14 1534)    Mary Moore is a pleasant 69 y.o. female presenting with vaginal discharge, rhinorrhea and chest pain and diffuse pruritus. No jaundice. Patient is moderate risk by heart score, very atypical chest pain. Pelvic exam unremarkable. Will perform cardiac workup and will need a delta troponin. EKG nonischemic, troponin is negative, blood work with no abnormality including normal bilirubin. Patient's urinalysis is not consistent with infection and wet prep is reassuring. Patient will be given Pepcid for the burning sensation in her chest, Atarax for itching and also topical hydrocortisone ointment. Patient is amenable to stay in the ED for a delta troponin.  Patient is  Evaluation does not show pathology that would require ongoing emergent intervention or inpatient treatment. Pt is hemodynamically stable and mentating appropriately. Discussed findings and plan with patient/guardian, who agrees with care plan. All questions answered. Return precautions discussed and outpatient follow up given.   Discharge Medication List as of 09/29/2014  4:22 PM    START taking these medications   Details  famotidine (PEPCID) 20 MG tablet Take 1 tablet (20 mg total) by mouth daily. OTC, Starting 09/29/2014, Until Discontinued, Print    hydrOXYzine (ATARAX/VISTARIL) 25 MG tablet Take 1 tablet (25 mg total) by mouth every 6 (six) hours., Starting 09/29/2014, Until Discontinued, Print             Wynetta Emery, PA-C 09/29/14 1805  Marily Memos, MD 09/30/14 2223

## 2014-09-29 NOTE — ED Notes (Signed)
NP at bedside.

## 2014-09-29 NOTE — Discharge Instructions (Signed)
It is never normal to have postmenopausal vaginal bleeding, you must follow with your OB/GYN at your appointment next week for full evaluation of this.  Please follow with your primary care doctor in the next 2 days for a check-up. They must obtain records for further management.   Do not hesitate to return to the Emergency Department for any new, worsening or concerning symptoms.   Chest Pain (Nonspecific) It is often hard to give a diagnosis for the cause of chest pain. There is always a chance that your pain could be related to something serious, such as a heart attack or a blood clot in the lungs. You need to follow up with your doctor. HOME CARE  If antibiotic medicine was given, take it as directed by your doctor. Finish the medicine even if you start to feel better.  For the next few days, avoid activities that bring on chest pain. Continue physical activities as told by your doctor.  Do not use any tobacco products. This includes cigarettes, chewing tobacco, and e-cigarettes.  Avoid drinking alcohol.  Only take medicine as told by your doctor.  Follow your doctor's suggestions for more testing if your chest pain does not go away.  Keep all doctor visits you made. GET HELP IF:  Your chest pain does not go away, even after treatment.  You have a rash with blisters on your chest.  You have a fever. GET HELP RIGHT AWAY IF:   You have more pain or pain that spreads to your arm, neck, jaw, back, or belly (abdomen).  You have shortness of breath.  You cough more than usual or cough up blood.  You have very bad back or belly pain.  You feel sick to your stomach (nauseous) or throw up (vomit).  You have very bad weakness.  You pass out (faint).  You have chills. This is an emergency. Do not wait to see if the problems will go away. Call your local emergency services (911 in [place] Do not drive yourself to the hospital. MAKE SURE YOU:   Understand these  instructions.  Will watch your condition.  Will get help right away if you are not doing well or get worse. Document Released: 06/13/2007 Document Revised: 12/30/2012 Document Reviewed: 06/13/2007 [facility] Patient Information 2015 [place], [place]. This information is not intended to replace advice given to you by your health care provider. Make sure you discuss any questions you have with your health care provider.

## 2014-09-30 LAB — GC/CHLAMYDIA PROBE AMP (~~LOC~~) NOT AT ARMC
Chlamydia: NEGATIVE
NEISSERIA GONORRHEA: NEGATIVE

## 2016-04-17 ENCOUNTER — Encounter (HOSPITAL_BASED_OUTPATIENT_CLINIC_OR_DEPARTMENT_OTHER): Payer: Self-pay | Admitting: *Deleted

## 2016-04-17 ENCOUNTER — Emergency Department (HOSPITAL_BASED_OUTPATIENT_CLINIC_OR_DEPARTMENT_OTHER): Payer: 59

## 2016-04-17 ENCOUNTER — Emergency Department (HOSPITAL_BASED_OUTPATIENT_CLINIC_OR_DEPARTMENT_OTHER)
Admission: EM | Admit: 2016-04-17 | Discharge: 2016-04-17 | Disposition: A | Discharge status: Home / Self Care | Payer: 59 | Attending: Emergency Medicine | Admitting: Emergency Medicine

## 2016-04-17 DIAGNOSIS — R079 Chest pain, unspecified: Secondary | ICD-10-CM | POA: Diagnosis not present

## 2016-04-17 DIAGNOSIS — Z7982 Long term (current) use of aspirin: Secondary | ICD-10-CM | POA: Diagnosis not present

## 2016-04-17 DIAGNOSIS — R0602 Shortness of breath: Secondary | ICD-10-CM | POA: Insufficient documentation

## 2016-04-17 DIAGNOSIS — R110 Nausea: Secondary | ICD-10-CM | POA: Diagnosis not present

## 2016-04-17 DIAGNOSIS — J45909 Unspecified asthma, uncomplicated: Secondary | ICD-10-CM | POA: Insufficient documentation

## 2016-04-17 DIAGNOSIS — Z79899 Other long term (current) drug therapy: Secondary | ICD-10-CM | POA: Diagnosis not present

## 2016-04-17 DIAGNOSIS — R1013 Epigastric pain: Secondary | ICD-10-CM | POA: Diagnosis present

## 2016-04-17 DIAGNOSIS — F17200 Nicotine dependence, unspecified, uncomplicated: Secondary | ICD-10-CM | POA: Diagnosis not present

## 2016-04-17 HISTORY — DX: Gastro-esophageal reflux disease without esophagitis: K21.9

## 2016-04-17 LAB — COMPREHENSIVE METABOLIC PANEL
ALBUMIN: 3.6 g/dL (ref 3.5–5.0)
ALK PHOS: 60 U/L (ref 38–126)
ALT: 16 U/L (ref 14–54)
ANION GAP: 8 (ref 5–15)
AST: 22 U/L (ref 15–41)
BILIRUBIN TOTAL: 0.3 mg/dL (ref 0.3–1.2)
BUN: 14 mg/dL (ref 6–20)
CALCIUM: 9.4 mg/dL (ref 8.9–10.3)
CO2: 25 mmol/L (ref 22–32)
Chloride: 106 mmol/L (ref 101–111)
Creatinine, Ser: 0.64 mg/dL (ref 0.44–1.00)
GLUCOSE: 96 mg/dL (ref 65–99)
POTASSIUM: 4.2 mmol/L (ref 3.5–5.1)
Sodium: 139 mmol/L (ref 135–145)
TOTAL PROTEIN: 7.1 g/dL (ref 6.5–8.1)

## 2016-04-17 LAB — CBC WITH DIFFERENTIAL/PLATELET
Basophils Absolute: 0 10*3/uL (ref 0.0–0.1)
Basophils Relative: 1 %
EOS ABS: 0.4 10*3/uL (ref 0.0–0.7)
EOS PCT: 6 %
HEMATOCRIT: 42.5 % (ref 36.0–46.0)
HEMOGLOBIN: 14.9 g/dL (ref 12.0–15.0)
LYMPHS ABS: 2.5 10*3/uL (ref 0.7–4.0)
Lymphocytes Relative: 40 %
MCH: 32.5 pg (ref 26.0–34.0)
MCHC: 35.1 g/dL (ref 30.0–36.0)
MCV: 92.8 fL (ref 78.0–100.0)
Monocytes Absolute: 0.7 10*3/uL (ref 0.1–1.0)
Monocytes Relative: 10 %
NEUTROS PCT: 43 %
Neutro Abs: 2.8 10*3/uL (ref 1.7–7.7)
Platelets: 240 10*3/uL (ref 150–400)
RBC: 4.58 MIL/uL (ref 3.87–5.11)
RDW: 12.6 % (ref 11.5–15.5)
WBC: 6.4 10*3/uL (ref 4.0–10.5)

## 2016-04-17 LAB — TROPONIN I

## 2016-04-17 LAB — LIPASE, BLOOD: Lipase: 17 U/L (ref 11–51)

## 2016-04-17 MED ORDER — PANTOPRAZOLE SODIUM 40 MG PO TBEC
40.0000 mg | DELAYED_RELEASE_TABLET | Freq: Once | ORAL | Status: AC
  Administered 2016-04-17: 40 mg via ORAL

## 2016-04-17 MED ORDER — OMEPRAZOLE 20 MG PO CPDR: 20.0000 mg | DELAYED_RELEASE_CAPSULE | Freq: Every day | ORAL | 0 refills | Status: AC

## 2016-04-17 MED ORDER — PANTOPRAZOLE SODIUM 40 MG PO TBEC
DELAYED_RELEASE_TABLET | ORAL | Status: AC
  Filled 2016-04-17: qty 1

## 2016-04-17 NOTE — ED Notes (Signed)
ED Provider at bedside. 

## 2016-04-17 NOTE — ED Notes (Signed)
Complaint of burning sensation from her mid chest going down to her abdomen.

## 2016-04-17 NOTE — ED Triage Notes (Signed)
Epigastric pain x 3-4 days. Burning pain. States it feels like her GERD but she has not taken medication for reflux in 3 years. Recently her BP has been elevated.

## 2016-04-17 NOTE — ED Provider Notes (Signed)
[facility] Provider Note   CSN: [id number] Arrival date & time: 04/17/16  2041  By signing my name below, I, Mary Moore, attest that this documentation has been prepared under the direction and in the presence of Benjiman Core, MD. Electronically Signed: Doreatha Moore, ED Scribe. 04/17/16. 9:18 PM.     History   Chief Complaint Chief Complaint  Patient presents with  . Abdominal Pain  . Chest Pain    HPI Mary Moore is a 71 y.o. female who presents to the Emergency Department complaining of moderate, constant burning lower CP with radiation to the epigastrium that began 3-4 days ago with associated nausea. She is tolerating food and fluids. No worsening or alleviating factors noted. Pt reports h/o similar symptoms with prior episodes of GERD. She has eaten applesauce with no relief, which she reports normally relieves her GERD pain. She also states she has occasional SOB.  No h/o cardiac disease. Last stress test was >10 years ago. Pt denies vomiting, diarrhea, dysuria, frequency, urgency, hematuria.   The history is provided by the patient. No language interpreter was used.    Past Medical History:  Diagnosis Date  . Asthma   . GERD (gastroesophageal reflux disease)   . Hypercholesteremia   . Vitamin D deficiency     There are no active problems to display for this patient.   Past Surgical History:  Procedure Laterality Date  . BREAST LUMPECTOMY    . LEG SURGERY    . TUBAL LIGATION      OB History    No data available       Home Medications    Prior to Admission medications   Medication Sig Start Date End Date Taking? Authorizing Provider  albuterol (PROVENTIL HFA;VENTOLIN HFA) 108 (90 BASE) MCG/ACT inhaler Inhale 2 puffs into the lungs every 6 (six) hours as needed.     Yes Historical Provider, MD  aspirin 81 MG tablet Take 81 mg by mouth daily.     Yes Historical Provider, MD  calcium citrate-vitamin D 200-200 MG-UNIT TABS Take 2 tablets  by mouth daily.     Yes Historical Provider, MD  cetirizine (ZYRTEC) 10 MG tablet Take 10 mg by mouth daily.     Yes Historical Provider, MD  clonazePAM (KLONOPIN) 1 MG tablet Take 1 mg by mouth at bedtime as needed.     Yes Historical Provider, MD  clotrimazole-betamethasone (LOTRISONE) cream Apply 1 application topically 2 (two) times daily.     Yes Historical Provider, MD  fish oil-omega-3 fatty acids 1000 MG capsule Take 1 g by mouth daily.     Yes Historical Provider, MD  Multiple Vitamin (MULTIVITAMIN) capsule Take 1 capsule by mouth daily.     Yes Historical Provider, MD  famotidine (PEPCID) 20 MG tablet Take 1 tablet (20 mg total) by mouth daily. OTC 09/29/14   Nicole Pisciotta, PA-C  hydrOXYzine (ATARAX/VISTARIL) 25 MG tablet Take 1 tablet (25 mg total) by mouth every 6 (six) hours. 09/29/14   Nicole Pisciotta, PA-C  omeprazole (PRILOSEC) 20 MG capsule Take 1 capsule (20 mg total) by mouth daily. 04/17/16   Benjiman Core, MD    Family History No family history on file.  Social History Social History  Substance Use Topics  . Smoking status: Current Every Day Smoker  . Smokeless tobacco: Never Used  . Alcohol use Yes     Allergies   Codeine sulfate; Ibuprofen; Other; Pravastatin sodium; and Penicillins   Review of Systems Review of Systems  Respiratory: Positive for shortness of breath.   Cardiovascular: Positive for chest pain.  Gastrointestinal: Positive for abdominal pain and nausea. Negative for diarrhea and vomiting.  Genitourinary: Negative for dysuria, frequency, hematuria and urgency.     Physical Exam Updated Vital Signs BP (!) 147/69   Pulse 63   Temp 98.1 F (36.7 C) (Oral)   Resp 12   Ht  (1.6 m)   Wt 226 lb (102.5 kg)   SpO2 100%   BMI 40.03 kg/m   Physical Exam  Constitutional: She appears well-developed and well-nourished.  HENT:  Head: Normocephalic.  Eyes: Conjunctivae are normal.  Cardiovascular: Normal rate.   Pulmonary/Chest:  Effort normal. No respiratory distress.  Abdominal: Soft. She exhibits no distension and no mass. There is tenderness. There is no rebound and no guarding.  Epigastric tenderness, no rebound, no guarding, no mass.   Musculoskeletal: Normal range of motion.  Neurological: She is alert.  Skin: Skin is warm and dry.  Psychiatric: She has a normal mood and affect. Her behavior is normal.  Nursing note and vitals reviewed.    ED Treatments / Results   DIAGNOSTIC STUDIES: Oxygen Saturation is 98% on RA, normal by my interpretation.    COORDINATION OF CARE: 9:15 PM Discussed treatment plan with pt at bedside which includes bedside US and pt agreed to plan.    Labs (all labs ordered are listed, but only abnormal results are displayed) Labs Reviewed  COMPREHENSIVE METABOLIC PANEL  LIPASE, BLOOD  TROPONIN I  CBC WITH DIFFERENTIAL/PLATELET    EKG  EKG Interpretation  Date/Time:  Tuesday April 17 2016 20:55:19 EDT Ventricular Rate:  67 PR Interval:  158 QRS Duration: 76 QT Interval:  410 QTC Calculation: 433 R Axis:   13 Text Interpretation:  Normal sinus rhythm Inferior infarct , age undetermined Abnormal ECG No significant change since last tracing Confirmed by Rubin Payor  MD, Monique Hefty [phone]) on 04/17/2016 9:12:33 PM       Radiology Dg Chest 2 View  Result Date: 04/17/2016 CLINICAL DATA:  Chest pain EXAM: CHEST  2 VIEW COMPARISON:  Chest radiograph 09/29/2014 FINDINGS: The heart size and mediastinal contours are within normal limits. There is calcification in the aortic arch, unchanged. Both lungs are clear. The visualized skeletal structures are unremarkable. IMPRESSION: No active cardiopulmonary disease. Calcific aortic atherosclerosis. Electronically Signed   By: Deatra Robinson M.D.   On: 04/17/2016 22:08    Procedures Procedures (including critical care time)  Medications Ordered in ED Medications - No data to display   Initial Impression / Assessment and Plan / ED  Course  I have reviewed the triage vital signs and the nursing notes.  Pertinent labs & imaging results that were available during my care of the patient were reviewed by me and considered in my medical decision making (see chart for details).     Patient with epigastric pain. Feels like previous GERD. Has had counseling for 3-4 days. EKG x-ray reassuring. Aortic cause felt less likely. Enzymes negative. Will discharge home to follow-up with primary care doctor. We'll start a short course of Prilosec.  Final Clinical Impressions(s) / ED Diagnoses   Final diagnoses:  Epigastric pain    New Prescriptions New Prescriptions   OMEPRAZOLE (PRILOSEC) 20 MG CAPSULE    Take 1 capsule (20 mg total) by mouth daily.    I personally performed the services described in this documentation, which was scribed in my presence. The recorded information has been reviewed and is accurate.  Benjiman Core, MD 04/17/16 [phone]

## 2018-02-10 ENCOUNTER — Encounter (HOSPITAL_BASED_OUTPATIENT_CLINIC_OR_DEPARTMENT_OTHER): Payer: Self-pay

## 2018-02-10 ENCOUNTER — Emergency Department (HOSPITAL_BASED_OUTPATIENT_CLINIC_OR_DEPARTMENT_OTHER): Payer: Medicare Other

## 2018-02-10 ENCOUNTER — Other Ambulatory Visit: Payer: Self-pay

## 2018-02-10 ENCOUNTER — Emergency Department (HOSPITAL_BASED_OUTPATIENT_CLINIC_OR_DEPARTMENT_OTHER)
Admission: EM | Admit: 2018-02-10 | Discharge: 2018-02-10 | Disposition: A | Discharge status: Home / Self Care | Payer: Medicare Other | Attending: Emergency Medicine | Admitting: Emergency Medicine

## 2018-02-10 DIAGNOSIS — N2889 Other specified disorders of kidney and ureter: Secondary | ICD-10-CM

## 2018-02-10 DIAGNOSIS — M5136 Other intervertebral disc degeneration, lumbar region: Secondary | ICD-10-CM

## 2018-02-10 DIAGNOSIS — R51 Headache: Secondary | ICD-10-CM | POA: Insufficient documentation

## 2018-02-10 DIAGNOSIS — M549 Dorsalgia, unspecified: Secondary | ICD-10-CM | POA: Diagnosis not present

## 2018-02-10 DIAGNOSIS — J45909 Unspecified asthma, uncomplicated: Secondary | ICD-10-CM | POA: Diagnosis not present

## 2018-02-10 DIAGNOSIS — R519 Headache, unspecified: Secondary | ICD-10-CM

## 2018-02-10 DIAGNOSIS — Z79899 Other long term (current) drug therapy: Secondary | ICD-10-CM | POA: Diagnosis not present

## 2018-02-10 DIAGNOSIS — F17210 Nicotine dependence, cigarettes, uncomplicated: Secondary | ICD-10-CM | POA: Diagnosis not present

## 2018-02-10 LAB — COMPREHENSIVE METABOLIC PANEL
ALT: 18 U/L (ref 0–44)
AST: 22 U/L (ref 15–41)
Albumin: 3.9 g/dL (ref 3.5–5.0)
Alkaline Phosphatase: 61 U/L (ref 38–126)
Anion gap: 7 (ref 5–15)
BUN: 15 mg/dL (ref 8–23)
CHLORIDE: 102 mmol/L (ref 98–111)
CO2: 22 mmol/L (ref 22–32)
Calcium: 9.1 mg/dL (ref 8.9–10.3)
Creatinine, Ser: 0.68 mg/dL (ref 0.44–1.00)
GFR calc Af Amer: >60 mL/min (ref >60)
GFR calc non Af Amer: >60 mL/min (ref >60)
Glucose, Bld: 96 mg/dL (ref 70–99)
Potassium: 4.4 mmol/L (ref 3.5–5.1)
Sodium: 131 mmol/L — ABNORMAL LOW (ref 135–145)
Total Bilirubin: 0.7 mg/dL (ref 0.3–1.2)
Total Protein: 7.1 g/dL (ref 6.5–8.1)

## 2018-02-10 LAB — CBC WITH DIFFERENTIAL/PLATELET
ABS IMMATURE GRANULOCYTES: 0.01 10*3/uL (ref 0.00–0.07)
Basophils Absolute: 0.1 10*3/uL (ref 0.0–0.1)
Basophils Relative: 1 %
EOS PCT: 4 %
Eosinophils Absolute: 0.2 10*3/uL (ref 0.0–0.5)
HCT: 46.1 % — ABNORMAL HIGH (ref 36.0–46.0)
HEMOGLOBIN: 14.9 g/dL (ref 12.0–15.0)
Immature Granulocytes: 0 %
LYMPHS PCT: 36 %
Lymphs Abs: 2.2 10*3/uL (ref 0.7–4.0)
MCH: 31 pg (ref 26.0–34.0)
MCHC: 32.3 g/dL (ref 30.0–36.0)
MCV: 95.8 fL (ref 80.0–100.0)
MONO ABS: 0.7 10*3/uL (ref 0.1–1.0)
MONOS PCT: 11 %
NEUTROS ABS: 2.9 10*3/uL (ref 1.7–7.7)
NEUTROS PCT: 48 %
NRBC: 0 % (ref 0.0–0.2)
Platelets: 240 10*3/uL (ref 150–400)
RBC: 4.81 MIL/uL (ref 3.87–5.11)
RDW: 12.4 % (ref 11.5–15.5)
WBC: 6 10*3/uL (ref 4.0–10.5)

## 2018-02-10 LAB — URINALYSIS, ROUTINE W REFLEX MICROSCOPIC
Bilirubin Urine: NEGATIVE
Glucose, UA: NEGATIVE mg/dL
Ketones, ur: NEGATIVE mg/dL
LEUKOCYTES UA: NEGATIVE
Nitrite: NEGATIVE
Protein, ur: NEGATIVE mg/dL
Specific Gravity, Urine: 1.02 (ref 1.005–1.030)
pH: 5.5 (ref 5.0–8.0)

## 2018-02-10 LAB — URINALYSIS, MICROSCOPIC (REFLEX)

## 2018-02-10 LAB — TROPONIN I: Troponin I: <0.03 ng/mL (ref <0.03)

## 2018-02-10 MED ORDER — ACETAMINOPHEN 500 MG PO TABS
1000.0000 mg | ORAL_TABLET | Freq: Once | ORAL | Status: AC
  Administered 2018-02-10: 1000 mg via ORAL
  Filled 2018-02-10: qty 2

## 2018-02-10 NOTE — ED Provider Notes (Signed)
[facility] EMERGENCY DEPARTMENT Provider Note   CSN: [id number] Arrival date & time: 02/10/18  1220     History   Chief Complaint Chief Complaint  Patient presents with  . Multiple pain sites    HPI Mary Moore is a 73 y.o. female.  HPI  Left sided back pain, shoots across back. Lower back. Pain started around 10AM. Fell 4 years ago, no recent falls. Has not been lifting more.  Left lower back pain moves across back to right side Did feel some twinges into the chest, just a funny feeling, no true pain. Had tingling in both legs while walking in [employer] then stopped then kept walking and it went away. No weakness. No loss of control of bowel or bladder No shortness of breath No back pain like this before. Different from what she used to have.  No nausea or vomiting. Ate this AM.  Headache started this AM, every now and then headache shoots on left side and behind the eye.  Has had runny nose, clear.  Headache 5/10   Past Medical History:  Diagnosis Date  . Asthma   . GERD (gastroesophageal reflux disease)   . Hypercholesteremia   . Vitamin D deficiency     There are no active problems to display for this patient.   Past Surgical History:  Procedure Laterality Date  . BREAST LUMPECTOMY    . LEG SURGERY    . TUBAL LIGATION       OB History   No obstetric history on file.      Home Medications    Prior to Admission medications   Medication Sig Start Date End Date Taking? Authorizing Provider  albuterol (PROVENTIL HFA;VENTOLIN HFA) 108 (90 BASE) MCG/ACT inhaler Inhale 2 puffs into the lungs every 6 (six) hours as needed.      [provider]  aspirin 81 MG tablet Take 81 mg by mouth daily.      [provider]  calcium citrate-vitamin D 200-200 MG-UNIT TABS Take 2 tablets by mouth daily.      [provider]  cetirizine (ZYRTEC) 10 MG tablet Take 10 mg by mouth daily.      [provider]  clonazePAM  (KLONOPIN) 1 MG tablet Take 1 mg by mouth at bedtime as needed.      [provider]  clotrimazole-betamethasone (LOTRISONE) cream Apply 1 application topically 2 (two) times daily.      [provider]  famotidine (PEPCID) 20 MG tablet Take 1 tablet (20 mg total) by mouth daily. OTC 09/29/14   Pisciotta, Joni ReiningNicole, PA-C  fish oil-omega-3 fatty acids 1000 MG capsule Take 1 g by mouth daily.      [provider]  hydrOXYzine (ATARAX/VISTARIL) 25 MG tablet Take 1 tablet (25 mg total) by mouth every 6 (six) hours. 09/29/14   Pisciotta, Joni ReiningNicole, PA-C  Multiple Vitamin (MULTIVITAMIN) capsule Take 1 capsule by mouth daily.      [provider]  omeprazole (PRILOSEC) 20 MG capsule Take 1 capsule (20 mg total) by mouth daily. 04/17/16   Benjiman CorePickering, Nathan, MD    Family History No family history on file.  Social History Social History   Tobacco Use  . Smoking status: Current Every Day Smoker    Types: Cigarettes  . Smokeless tobacco: Never Used  Substance Use Topics  . Alcohol use: Yes    Comment: occ  . Drug use: No     Allergies   Codeine sulfate; Ibuprofen; Other; Pravastatin  sodium; and Penicillins   Review of Systems Review of Systems  Constitutional: Negative for fever.  HENT: Positive for rhinorrhea. Negative for sore throat.   Eyes: Negative for visual disturbance.  Respiratory: Negative for cough and shortness of breath.   Cardiovascular: Negative for chest pain (twinge, like tingling).  Gastrointestinal: Negative for abdominal pain, nausea and vomiting.  Genitourinary: Negative for difficulty urinating and dysuria.  Musculoskeletal: Positive for back pain. Negative for neck pain.  Skin: Negative for rash.  Neurological: Positive for numbness (had episode of bilateral leg numbness while walking) and headaches. Negative for syncope and weakness.     Physical Exam Updated Vital Signs BP 126/79 (BP Location: Left Arm)   Pulse 77   Temp 97.9 F  (36.6 C) (Oral)   Resp 16   Ht 5\' 3"  (1.6 m)   Wt 110.2 kg   SpO2 99%   BMI 43.05 kg/m   Physical Exam Vitals signs and nursing note reviewed.  Constitutional:      General: She is not in acute distress.    Appearance: She is well-developed. She is not diaphoretic.  HENT:     Head: Normocephalic and atraumatic.  Eyes:     Conjunctiva/sclera: Conjunctivae normal.  Neck:     Musculoskeletal: Normal range of motion.  Cardiovascular:     Rate and Rhythm: Normal rate and regular rhythm.     Heart sounds: Normal heart sounds. No murmur. No friction rub. No gallop.   Pulmonary:     Effort: Pulmonary effort is normal. No respiratory distress.     Breath sounds: Normal breath sounds. No wheezing or rales.  Abdominal:     General: There is no distension.     Palpations: Abdomen is soft.     Tenderness: There is no abdominal tenderness. There is no guarding.  Musculoskeletal:     Lumbar back: She exhibits tenderness and bony tenderness.  Skin:    General: Skin is warm and dry.     Findings: No erythema or rash.  Neurological:     Mental Status: She is alert and oriented to person, place, and time.      ED Treatments / Results  Labs (all labs ordered are listed, but only abnormal results are displayed) Labs Reviewed  CBC WITH DIFFERENTIAL/PLATELET - Abnormal; Notable for the following components:      Result Value   HCT 46.1 (*)    All other components within normal limits  COMPREHENSIVE METABOLIC PANEL - Abnormal; Notable for the following components:   Sodium 131 (*)    All other components within normal limits  URINALYSIS, ROUTINE W REFLEX MICROSCOPIC - Abnormal; Notable for the following components:   Hgb urine dipstick TRACE (*)    All other components within normal limits  URINALYSIS, MICROSCOPIC (REFLEX) - Abnormal; Notable for the following components:   Bacteria, UA MANY (*)    All other components within normal limits  URINE CULTURE  TROPONIN I    EKG EKG  Interpretation  Date/Time:  Monday February 10 2018 14:39:18 EST Ventricular Rate:  63 PR Interval:    QRS Duration: 96 QT Interval:  436 QTC Calculation: 447 R Axis:   14 Text Interpretation:  Sinus rhythm Abnormal R-wave progression, early transition No significant change since last tracing Confirmed by Alvira Monday ([id number]) on 02/10/2018 7:47:46 PM   Radiology Dg Chest 2 View  Result Date: 02/10/2018 CLINICAL DATA:  Left chest pain following a fall 4 years ago. The pain returned yesterday. No recent  injury. Smoker. EXAM: CHEST - 2 VIEW COMPARISON:  04/17/2016. FINDINGS: Normal sized heart. Clear lungs. Mild peribronchial thickening with mild improvement. The lungs are mildly hyperexpanded. Mild thoracic spine degenerative changes. IMPRESSION: No acute abnormality. Mild changes of COPD and chronic bronchitis. Electronically Signed   By: Beckie Salts M.D.   On: 02/10/2018 14:19   Ct Head Wo Contrast  Result Date: 02/10/2018 CLINICAL DATA:  Left frontal headache today. No known injury. EXAM: CT HEAD WITHOUT CONTRAST TECHNIQUE: Contiguous axial images were obtained from the base of the skull through the vertex without intravenous contrast. COMPARISON:  01/07/2011. FINDINGS: Brain: Normal appearing cerebral hemispheres and posterior fossa structures. Normal size and position of the ventricles. No intracranial hemorrhage, mass lesion or CT evidence of acute infarction. Vascular: No hyperdense vessel or unexpected calcification. Skull: Normal. Negative for fracture or focal lesion. Sinuses/Orbits: Unremarkable. Other: None. IMPRESSION: Normal examination. Electronically Signed   By: Beckie Salts M.D.   On: 02/10/2018 14:40   Ct Renal Stone Study  Result Date: 02/10/2018 CLINICAL DATA:  Low abdomen and back pain and bilateral lower extremity numbness. No injury. EXAM: CT ABDOMEN AND PELVIS WITHOUT CONTRAST TECHNIQUE: Multidetector CT imaging of the abdomen and pelvis was performed following the  standard protocol without IV contrast. COMPARISON:  None. FINDINGS: Lower chest: Unremarkable. Hepatobiliary: Noncalcified gallstones in the gallbladder measuring up to 1.1 cm in maximum diameter each. No gallbladder wall thickening or pericholecystic fluid. Normal appearing liver. Pancreas: Unremarkable. No pancreatic ductal dilatation or surrounding inflammatory changes. Spleen: Normal in size without focal abnormality. Adrenals/Urinary Tract: 2.6 cm oval, low density right adrenal mass measuring 2.6 cm in maximum diameter. Normal appearing left adrenal gland. 7 mm exophytic medium to high density right renal mass on image number 32 series 2. Atheromatous arterial calcifications with no renal or ureteral calculi or hydronephrosis seen. Normal appearing urinary bladder. Stomach/Bowel: Stomach is within normal limits. Appendix appears normal. No evidence of bowel wall thickening, distention, or inflammatory changes. Vascular/Lymphatic: Atheromatous arterial calcifications without aneurysm. No enlarged lymph nodes. Reproductive: Mildly enlarged uterus containing multiple poorly defined masses, 1 with coarse calcifications. No adnexal masses. Other: Tiny umbilical hernia containing fat. Musculoskeletal: Lumbar and lower thoracic spine degenerative changes. Mild to moderate levoconvex lumbar scoliosis. Mild left hip degenerative changes. IMPRESSION: 1. No acute abnormality. 2. Cholelithiasis. 3. 2.6 cm right adrenal adenoma. 4. 7 mm exophytic medium to high density right renal mass. This could represent a small proteinaceous cyst or solid mass. Further evaluation with an elective pre and postcontrast MRI of the kidneys is recommended. 5. Fibroid uterus. Electronically Signed   By: Beckie Salts M.D.   On: 02/10/2018 14:35    Procedures Procedures (including critical care time)  Medications Ordered in ED Medications  acetaminophen (TYLENOL) tablet 1,000 mg (1,000 mg Oral Given 02/10/18 1406)     Initial  Impression / Assessment and Plan / ED Course  I have reviewed the triage vital signs and the nursing notes.  Pertinent labs & imaging results that were available during my care of the patient were reviewed by me and considered in my medical decision making (see chart for details).     73yo female with history of hyperlipidemia, asthma, GERD, presents with concern for lower back pain and headache.  CT head without acute abnormalities. History not consistent with SAH, meningitis.    Labs WNL. Suspect contamination of urine and do not see signs of UTI or pyelo.  Normal bilateral upper and lower extremity pulses. No signs of  acute arterial occlusion, doubt dissection.  Reports episode of tingling of bilateral legs while walking and lower back pain, suspect neurogenic claudication. Neurologic exam normal in ED, normal sensation and strength. No signs of cauda equina, no hx to suggest abscess. Reported tingling in chest for approx 3sec on ROS, EKG and troponin without acute findings, no sign of ACS.  CT completed shows no sign of nephrolithiasis or other acute abnormality. Noted degenerative spine changes which is likely cause of pain.  Discussed incidental renal mass that will need outpatient MRI. Recommend lidocaine patches and tylenol. Patient discharged in stable condition with understanding of reasons to return.    Final Clinical Impressions(s) / ED Diagnoses   Final diagnoses:  Lumbar degenerative disc disease  Renal mass  Acute nonintractable headache, unspecified headache type    ED Discharge Orders    None       Alvira Monday, MD 02/10/18 2122

## 2018-02-10 NOTE — ED Notes (Signed)
Patient transported to X-ray 

## 2018-02-10 NOTE — ED Triage Notes (Signed)
C/o lower abd pain, back pain, pain to left forehead x today-also c/o numbness to bilat LE-denies injury-pt with NAD-steady gait

## 2018-02-11 LAB — URINE CULTURE

## 2020-04-20 IMAGING — CT CT RENAL STONE PROTOCOL
2 of 4 series · 16 of 46 positions shown, 18 images · non-contrast
Comparison: None.

CLINICAL DATA: Low abdomen and back pain and bilateral lower
extremity numbness. No injury.

EXAM:
CT ABDOMEN AND PELVIS WITHOUT CONTRAST
TECHNIQUE: Multidetector CT imaging of the abdomen and pelvis was performed
following the standard protocol without IV contrast.

[Series 2: axial st · axial · 0.98mm/px · z∈[-816,-421]mm · 13 of 87 slices shown, 15 images]
[im 4/87  soft-tissue]
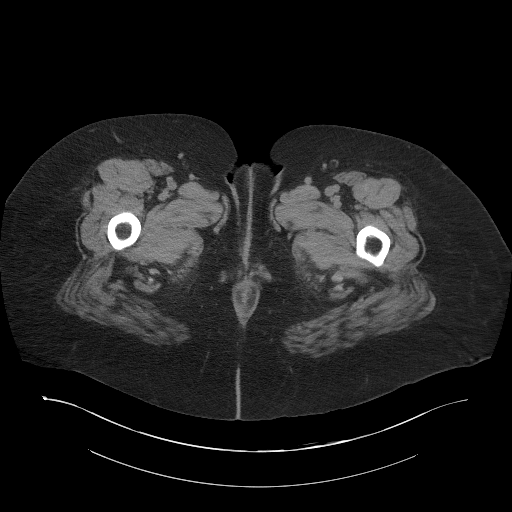
[im 4/87  bone]
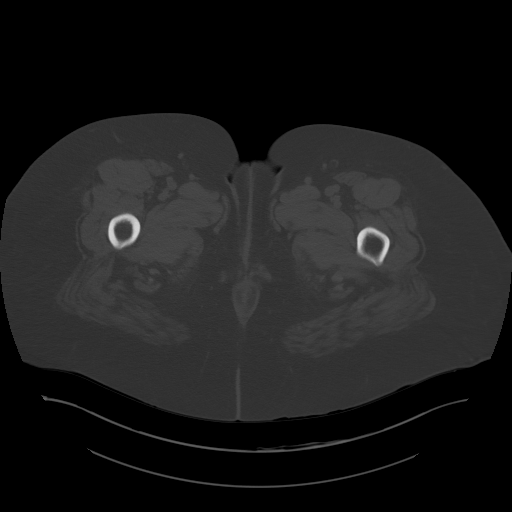
[im 12/87  soft-tissue]
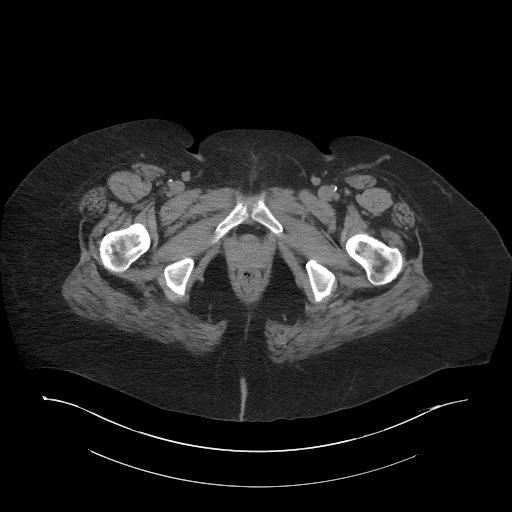
[im 19/87  soft-tissue]
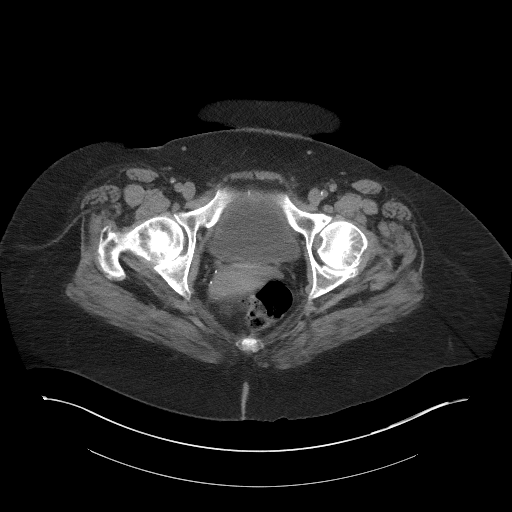
[im 23/87  soft-tissue]
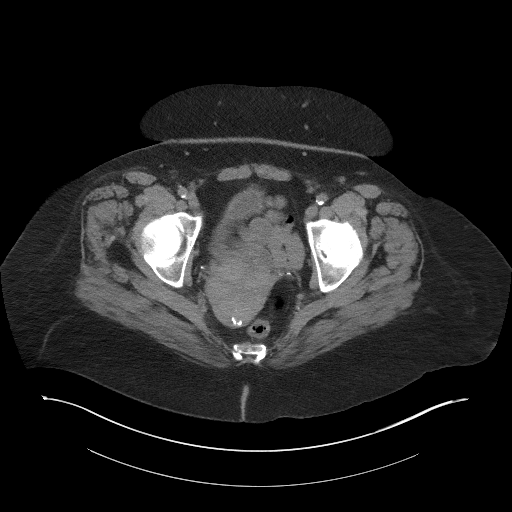
[im 30/87  soft-tissue]
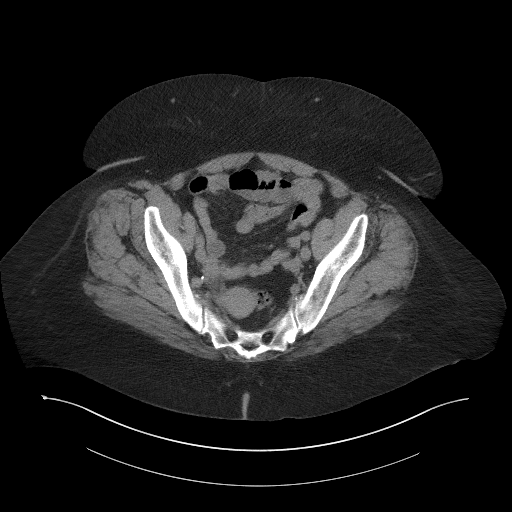
[im 38/87  soft-tissue]
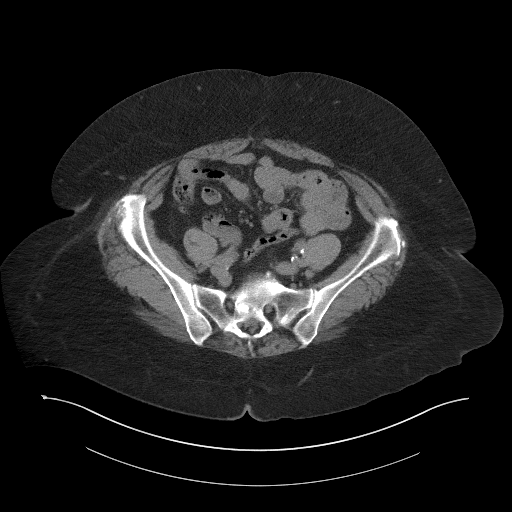
[im 45/87  soft-tissue]
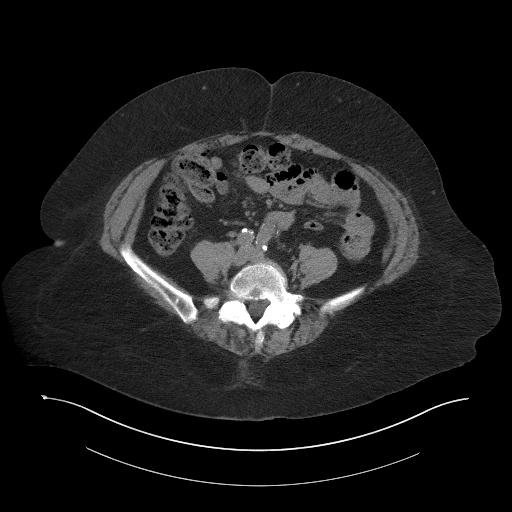
[im 49/87  soft-tissue]
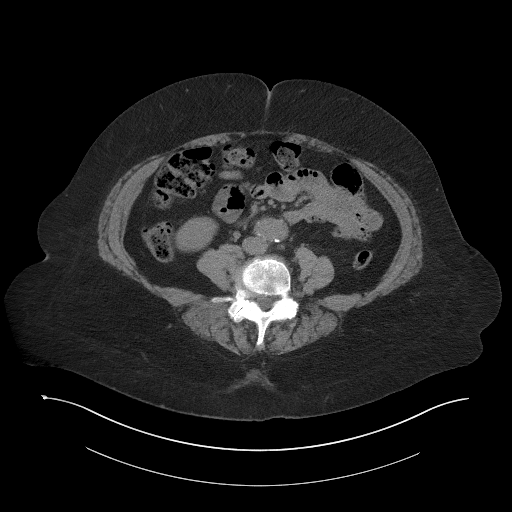
[im 57/87  soft-tissue]
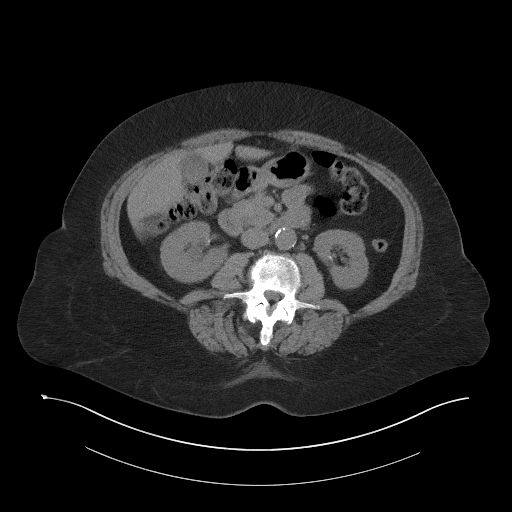
[im 57/87  bone]
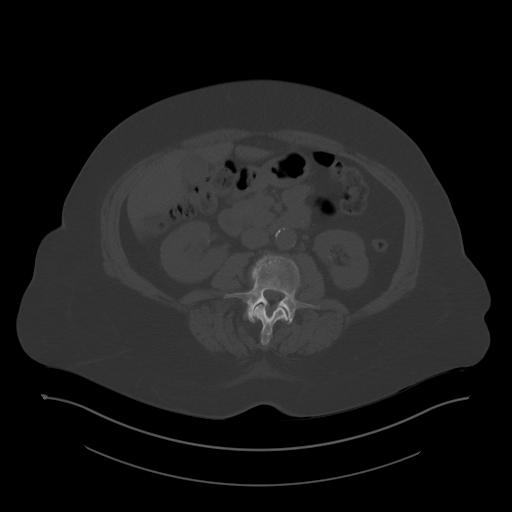
[im 64/87  soft-tissue]
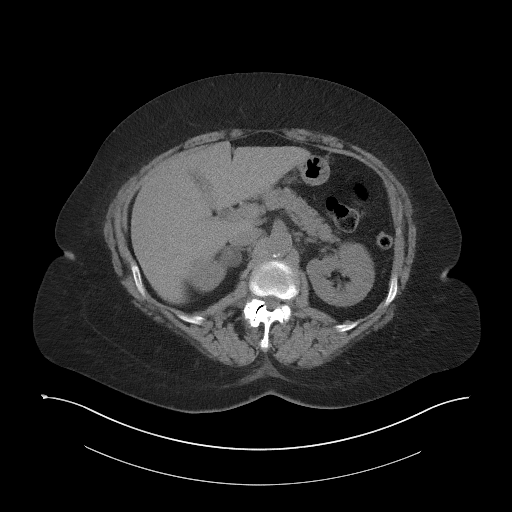
[im 68/87  soft-tissue]
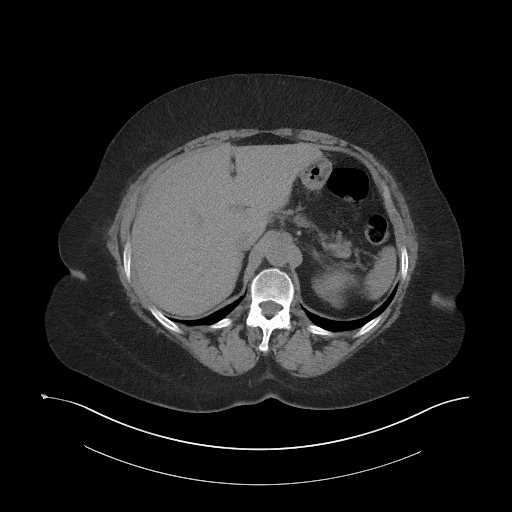
[im 75/87  soft-tissue]
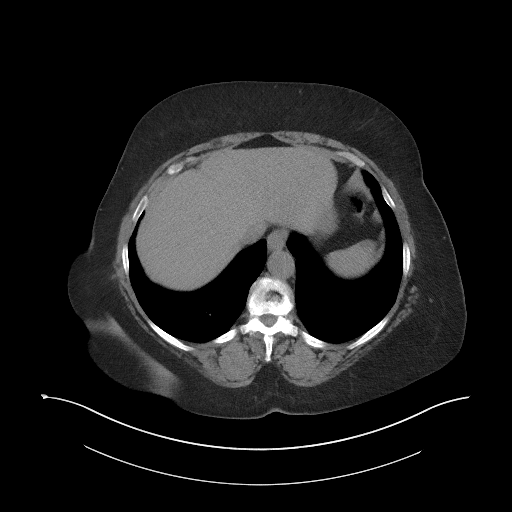
[im 83/87  soft-tissue]
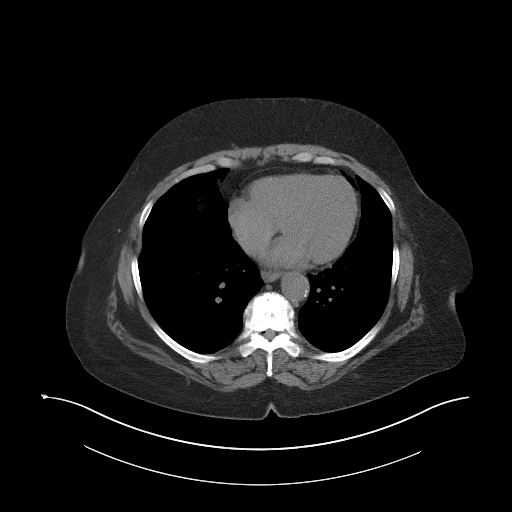

[Series 5: coronal st · coronal · 0.89mm/px · 3 of 111 slices shown]
[im 37/111  soft-tissue]
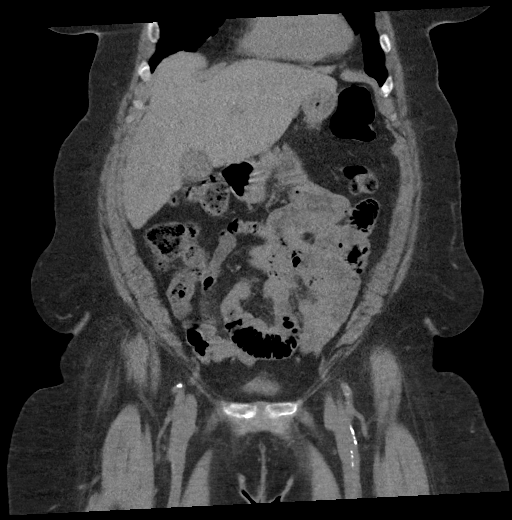
[im 49/111  soft-tissue]
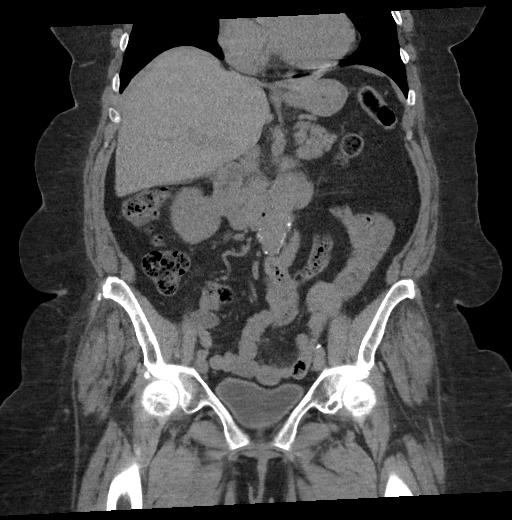
[im 62/111  soft-tissue]
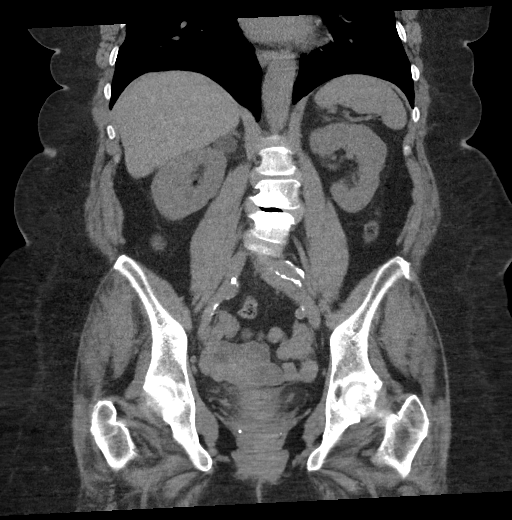

[16 of 46 positions shown; findings below may reference images not displayed]

FINDINGS: Lower chest: Unremarkable.

Hepatobiliary: Noncalcified gallstones in the gallbladder measuring
up to 1.1 cm in maximum diameter each. No gallbladder wall
thickening or pericholecystic fluid. Normal appearing liver.

Pancreas: Unremarkable. No pancreatic ductal dilatation or
surrounding inflammatory changes.

Spleen: Normal in size without focal abnormality.

Adrenals/Urinary Tract: 2.6 cm oval, low density right adrenal mass
measuring 2.6 cm in maximum diameter. Normal appearing left adrenal
gland. 7 mm exophytic medium to high density right renal mass on
image number 32 series 2. Atheromatous arterial calcifications with
no renal or ureteral calculi or hydronephrosis seen. Normal
appearing urinary bladder.

Stomach/Bowel: Stomach is within normal limits. Appendix appears
normal. No evidence of bowel wall thickening, distention, or
inflammatory changes.

Vascular/Lymphatic: Atheromatous arterial calcifications without
aneurysm. No enlarged lymph nodes.

Reproductive: Mildly enlarged uterus containing multiple poorly
defined masses, 1 with coarse calcifications. No adnexal masses.

Other: Tiny umbilical hernia containing fat.

Musculoskeletal: Lumbar and lower thoracic spine degenerative
changes. Mild to moderate levoconvex lumbar scoliosis. Mild left hip
degenerative changes.
IMPRESSION: 1. No acute abnormality.
2. Cholelithiasis.
3. 2.6 cm right adrenal adenoma.
4. 7 mm exophytic medium to high density right renal mass. This
could represent a small proteinaceous cyst or solid mass. Further
evaluation with an elective pre and postcontrast MRI of the kidneys
is recommended.
5. Fibroid uterus.

## 2020-04-20 IMAGING — CT CT HEAD W/O CM
3 series · 16 of 47 positions shown, 19 images · non-contrast
Comparison: 01/07/2011.

CLINICAL DATA: Left frontal headache today. No known injury.

EXAM:
CT HEAD WITHOUT CONTRAST
TECHNIQUE: Contiguous axial images were obtained from the base of the skull
through the vertex without intravenous contrast.

[Series 2: head wo · axial · 0.42mm/px · z∈[-161,-36]mm · 10 of 31 slices shown, 13 images]
[im 3/31  brain]
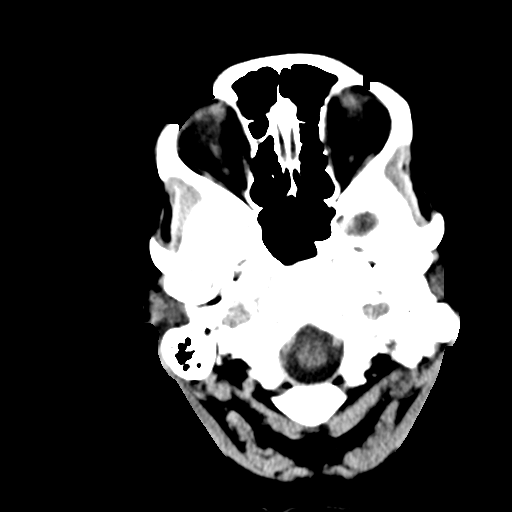
[im 3/31  bone]
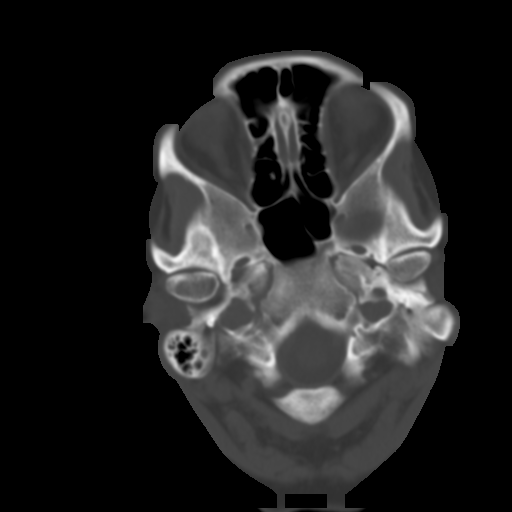
[im 6/31  brain]
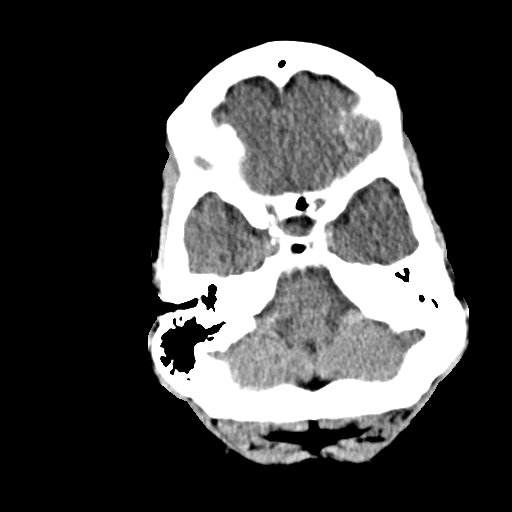
[im 9/31  brain]
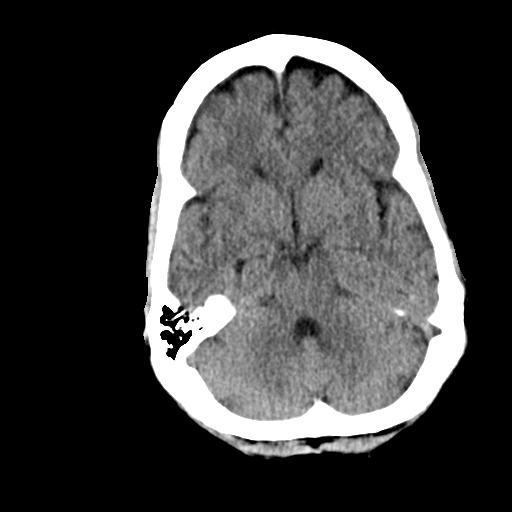
[im 11/31  brain]
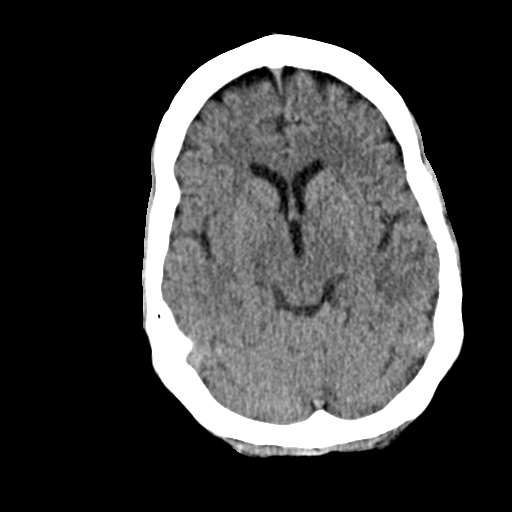
[im 14/31  brain]
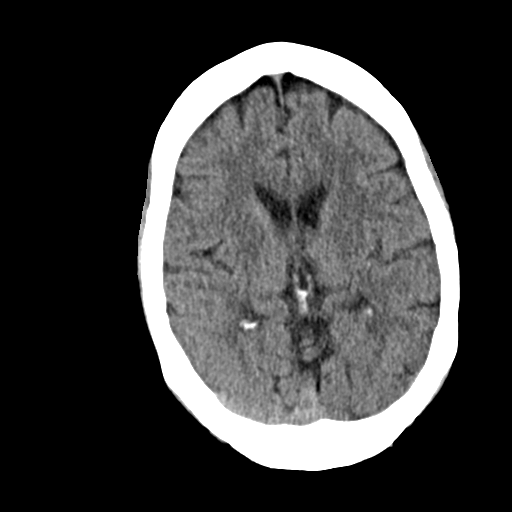
[im 14/31  bone]
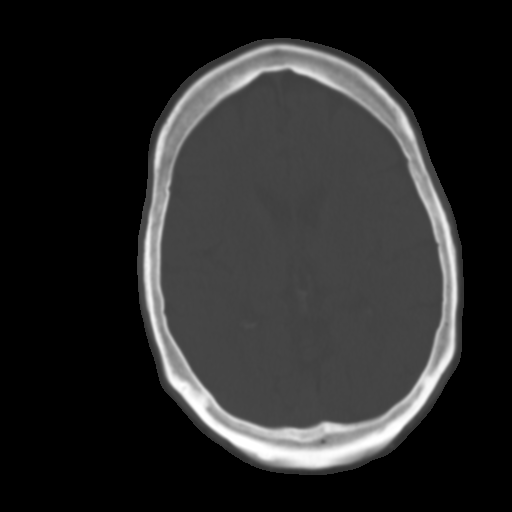
[im 17/31  brain]
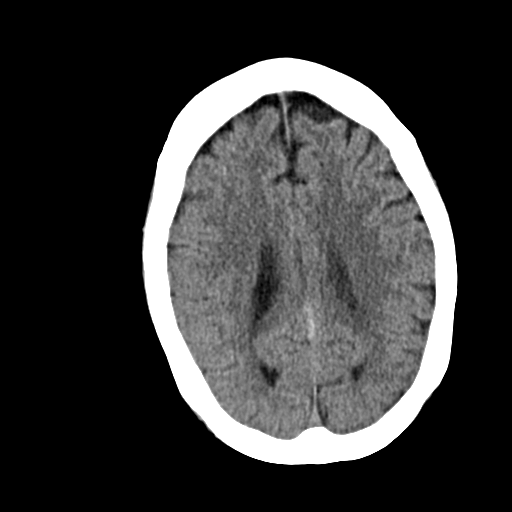
[im 20/31  brain]
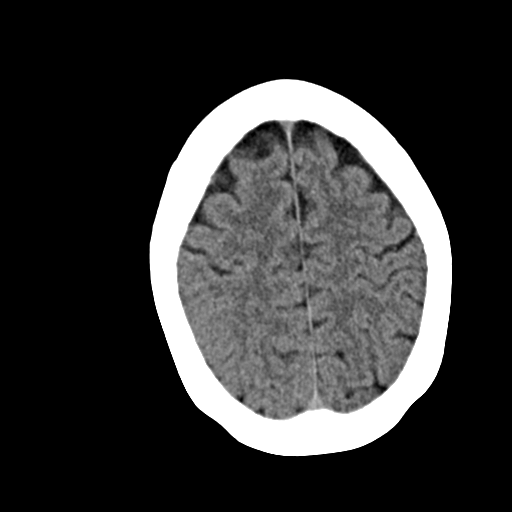
[im 23/31  brain]
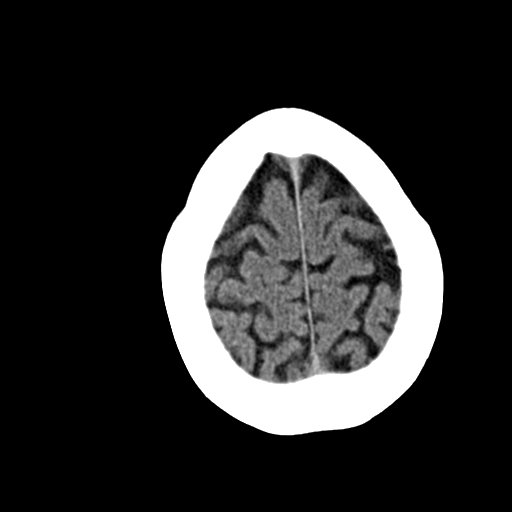
[im 25/31  brain]
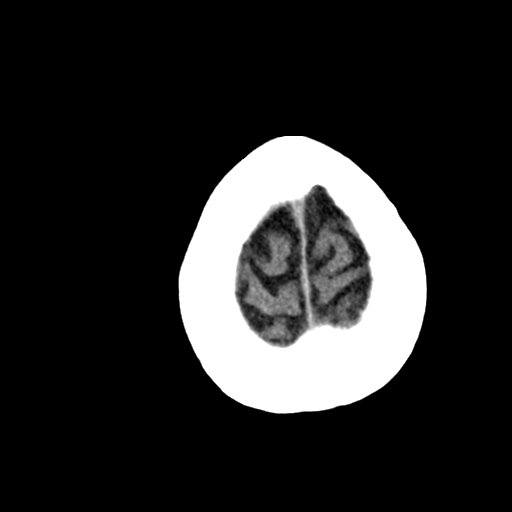
[im 25/31  bone]
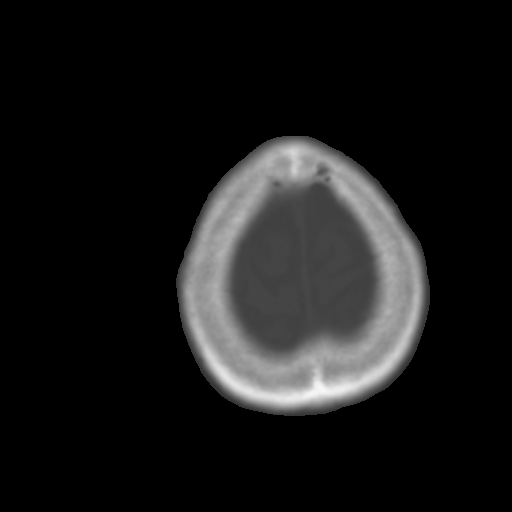
[im 28/31  brain]
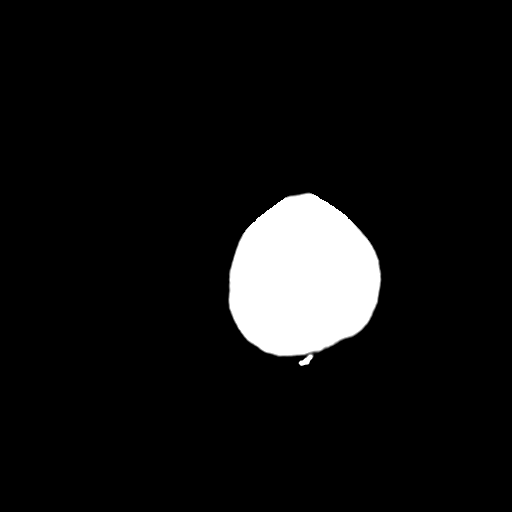

[Series 4: coronal soft · coronal · 0.30mm/px · 3 of 66 slices shown]
[im 22/66  brain]
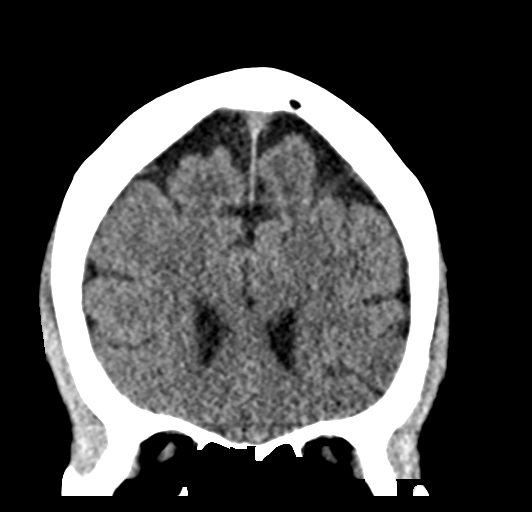
[im 29/66  brain]
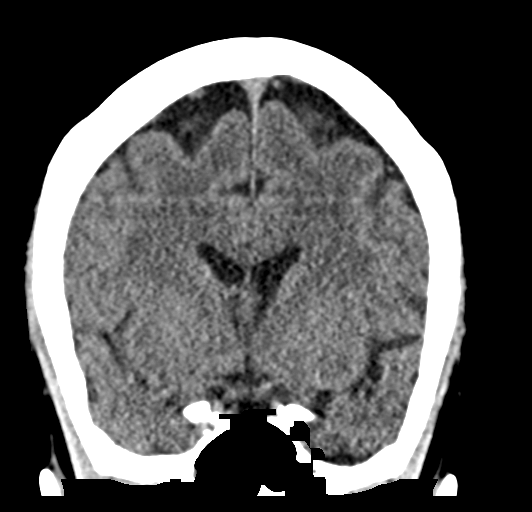
[im 37/66  brain]
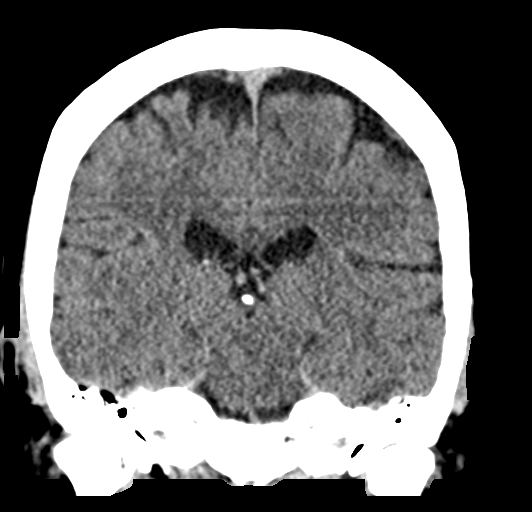

[Series 5: sag soft · sagittal · 0.29mm/px · 3 of 49 slices shown]
[im 17/49  brain]
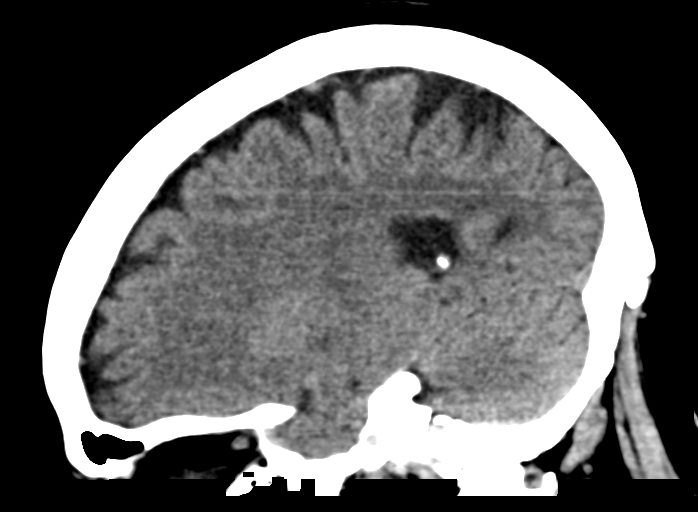
[im 25/49  brain]
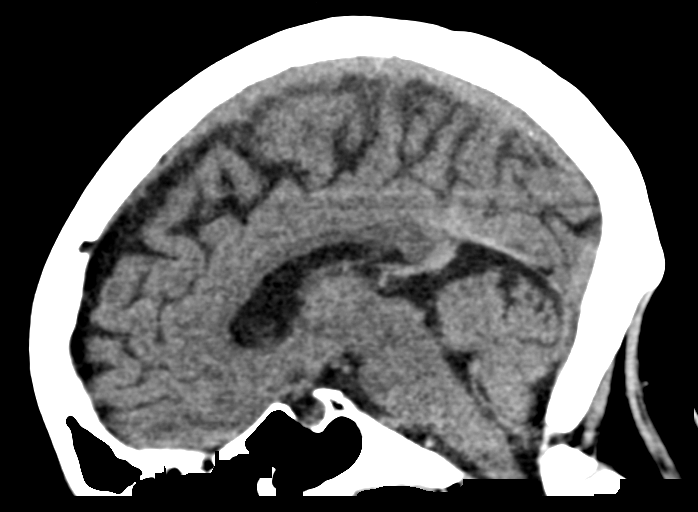
[im 33/49  brain]
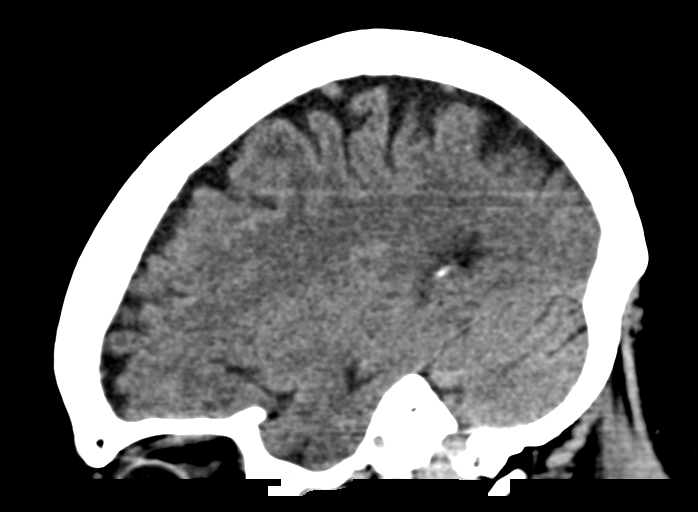

[16 of 47 positions shown; findings below may reference images not displayed]

FINDINGS: Brain: Normal appearing cerebral hemispheres and posterior fossa
structures. Normal size and position of the ventricles. No
intracranial hemorrhage, mass lesion or CT evidence of acute
infarction.

Vascular: No hyperdense vessel or unexpected calcification.

Skull: Normal. Negative for fracture or focal lesion.

Sinuses/Orbits: Unremarkable.

Other: None.
IMPRESSION: Normal examination.
